# Patient Record
Sex: Female | Born: 1990 | Hispanic: Yes | Smoking: Never smoker
Health system: Southern US, Community
[De-identification: ages and names within clinical notes are randomized; demographics above are authoritative.]

## PROBLEM LIST (undated history)

## (undated) DIAGNOSIS — E669 Obesity, unspecified: Secondary | ICD-10-CM

---

## 2013-02-21 ENCOUNTER — Emergency Department: Payer: Self-pay | Admitting: Emergency Medicine

## 2013-10-15 ENCOUNTER — Emergency Department: Payer: Self-pay | Admitting: Emergency Medicine

## 2013-10-15 LAB — COMPREHENSIVE METABOLIC PANEL
ALK PHOS: 140 U/L — AB
Albumin: 3.2 g/dL — ABNORMAL LOW (ref 3.4–5.0)
Anion Gap: 7 (ref 7–16)
BUN: 10 mg/dL (ref 7–18)
Bilirubin,Total: 0.2 mg/dL (ref 0.2–1.0)
CO2: 29 mmol/L (ref 21–32)
Calcium, Total: 8.5 mg/dL (ref 8.5–10.1)
Chloride: 106 mmol/L (ref 98–107)
Creatinine: 1.02 mg/dL (ref 0.60–1.30)
EGFR (African American): >60
EGFR (Non-African Amer.): >60
Glucose: 100 mg/dL — ABNORMAL HIGH (ref 65–99)
Osmolality: 282 (ref 275–301)
Potassium: 3.6 mmol/L (ref 3.5–5.1)
SGOT(AST): 63 U/L — ABNORMAL HIGH (ref 15–37)
SGPT (ALT): 89 U/L — ABNORMAL HIGH
SODIUM: 142 mmol/L (ref 136–145)
Total Protein: 7.9 g/dL (ref 6.4–8.2)

## 2013-10-15 LAB — URINALYSIS, COMPLETE
Bilirubin,UR: NEGATIVE
Blood: NEGATIVE
GLUCOSE, UR: NEGATIVE mg/dL (ref 0–75)
KETONE: NEGATIVE
Leukocyte Esterase: NEGATIVE
NITRITE: NEGATIVE
Ph: 7 (ref 4.5–8.0)
Protein: NEGATIVE
RBC,UR: 1 /HPF (ref 0–5)
SPECIFIC GRAVITY: 1.014 (ref 1.003–1.030)
WBC UR: 1 /HPF (ref 0–5)

## 2013-10-15 LAB — CBC
HCT: 39.2 % (ref 35.0–47.0)
HGB: 12.8 g/dL (ref 12.0–16.0)
MCH: 27.9 pg (ref 26.0–34.0)
MCHC: 32.7 g/dL (ref 32.0–36.0)
MCV: 85 fL (ref 80–100)
PLATELETS: 323 10*3/uL (ref 150–440)
RBC: 4.59 10*6/uL (ref 3.80–5.20)
RDW: 13.9 % (ref 11.5–14.5)
WBC: 12.4 10*3/uL — AB (ref 3.6–11.0)

## 2013-10-15 LAB — LIPASE, BLOOD: LIPASE: 94 U/L (ref 73–393)

## 2013-10-19 ENCOUNTER — Emergency Department: Payer: Self-pay | Admitting: Internal Medicine

## 2013-10-19 LAB — CBC WITH DIFFERENTIAL/PLATELET
Basophil #: 0.1 10*3/uL (ref 0.0–0.1)
Basophil %: 0.6 %
Eosinophil #: 0.2 10*3/uL (ref 0.0–0.7)
Eosinophil %: 1.8 %
HCT: 38.8 % (ref 35.0–47.0)
HGB: 12.6 g/dL (ref 12.0–16.0)
LYMPHS PCT: 27.5 %
Lymphocyte #: 3.6 10*3/uL (ref 1.0–3.6)
MCH: 27.5 pg (ref 26.0–34.0)
MCHC: 32.4 g/dL (ref 32.0–36.0)
MCV: 85 fL (ref 80–100)
Monocyte #: 0.6 x10 3/mm (ref 0.2–0.9)
Monocyte %: 4.9 %
NEUTROS ABS: 8.4 10*3/uL — AB (ref 1.4–6.5)
NEUTROS PCT: 65.2 %
PLATELETS: 360 10*3/uL (ref 150–440)
RBC: 4.56 10*6/uL (ref 3.80–5.20)
RDW: 13.7 % (ref 11.5–14.5)
WBC: 13 10*3/uL — ABNORMAL HIGH (ref 3.6–11.0)

## 2013-10-19 LAB — COMPREHENSIVE METABOLIC PANEL
ANION GAP: 7 (ref 7–16)
Albumin: 3.3 g/dL — ABNORMAL LOW (ref 3.4–5.0)
Alkaline Phosphatase: 137 U/L — ABNORMAL HIGH
BUN: 12 mg/dL (ref 7–18)
Bilirubin,Total: 0.2 mg/dL (ref 0.2–1.0)
CREATININE: 0.76 mg/dL (ref 0.60–1.30)
Calcium, Total: 8.6 mg/dL (ref 8.5–10.1)
Chloride: 105 mmol/L (ref 98–107)
Co2: 28 mmol/L (ref 21–32)
EGFR (African American): >60
EGFR (Non-African Amer.): >60
GLUCOSE: 111 mg/dL — AB (ref 65–99)
Osmolality: 280 (ref 275–301)
Potassium: 4.1 mmol/L (ref 3.5–5.1)
SGOT(AST): 54 U/L — ABNORMAL HIGH (ref 15–37)
SGPT (ALT): 69 U/L — ABNORMAL HIGH
Sodium: 140 mmol/L (ref 136–145)
Total Protein: 7.7 g/dL (ref 6.4–8.2)

## 2013-10-19 LAB — LIPASE, BLOOD: LIPASE: 135 U/L (ref 73–393)

## 2013-10-19 LAB — URINALYSIS, COMPLETE
Bacteria: NONE SEEN
Bilirubin,UR: NEGATIVE
Glucose,UR: NEGATIVE mg/dL (ref 0–75)
Ketone: NEGATIVE
Leukocyte Esterase: NEGATIVE
Nitrite: NEGATIVE
PROTEIN: NEGATIVE
Ph: 6 (ref 4.5–8.0)
Specific Gravity: 1.008 (ref 1.003–1.030)
Squamous Epithelial: 1

## 2013-10-30 DIAGNOSIS — E559 Vitamin D deficiency, unspecified: Secondary | ICD-10-CM | POA: Insufficient documentation

## 2013-10-30 DIAGNOSIS — Z6841 Body Mass Index (BMI) 40.0 and over, adult: Secondary | ICD-10-CM

## 2014-06-25 ENCOUNTER — Emergency Department
Admit: 2014-06-25 | Disposition: A | Discharge status: Home / Self Care | Payer: Self-pay | Admitting: Emergency Medicine

## 2014-06-25 LAB — CBC WITH DIFFERENTIAL/PLATELET
BASOS ABS: 0.1 10*3/uL (ref 0.0–0.1)
Basophil %: 0.5 %
EOS ABS: 0.4 10*3/uL (ref 0.0–0.7)
Eosinophil %: 2.5 %
HCT: 38.2 % (ref 35.0–47.0)
HGB: 12.9 g/dL (ref 12.0–16.0)
Lymphocyte #: 3.7 10*3/uL — ABNORMAL HIGH (ref 1.0–3.6)
Lymphocyte %: 25.7 %
MCH: 28 pg (ref 26.0–34.0)
MCHC: 33.7 g/dL (ref 32.0–36.0)
MCV: 83 fL (ref 80–100)
Monocyte #: 0.9 x10 3/mm (ref 0.2–0.9)
Monocyte %: 5.9 %
NEUTROS ABS: 9.5 10*3/uL — AB (ref 1.4–6.5)
Neutrophil %: 65.4 %
PLATELETS: 300 10*3/uL (ref 150–440)
RBC: 4.6 10*6/uL (ref 3.80–5.20)
RDW: 13.3 % (ref 11.5–14.5)
WBC: 14.4 10*3/uL — AB (ref 3.6–11.0)

## 2014-06-25 LAB — COMPREHENSIVE METABOLIC PANEL
ALT: 41 U/L
Albumin: 3.5 g/dL
Alkaline Phosphatase: 113 U/L
Anion Gap: 8 (ref 7–16)
BUN: 13 mg/dL
Bilirubin,Total: 0.2 mg/dL — ABNORMAL LOW
CREATININE: 0.42 mg/dL — AB
Calcium, Total: 8.7 mg/dL — ABNORMAL LOW
Chloride: 105 mmol/L
Co2: 23 mmol/L
Glucose: 108 mg/dL — ABNORMAL HIGH
Potassium: 3.6 mmol/L
SGOT(AST): 40 U/L
Sodium: 136 mmol/L
TOTAL PROTEIN: 7.6 g/dL

## 2014-06-25 LAB — URINALYSIS, COMPLETE
Bacteria: NONE SEEN
Bilirubin,UR: NEGATIVE
Glucose,UR: NEGATIVE mg/dL (ref 0–75)
Ketone: NEGATIVE
LEUKOCYTE ESTERASE: NEGATIVE
Nitrite: NEGATIVE
PH: 7 (ref 4.5–8.0)
Protein: NEGATIVE
Specific Gravity: 1.015 (ref 1.003–1.030)
WBC UR: NONE SEEN /HPF (ref 0–5)

## 2014-06-25 LAB — WET PREP, GENITAL

## 2014-06-26 LAB — GC/CHLAMYDIA PROBE AMP

## 2015-05-29 ENCOUNTER — Encounter: Payer: Self-pay | Admitting: Emergency Medicine

## 2015-05-29 ENCOUNTER — Emergency Department: Payer: Self-pay

## 2015-05-29 ENCOUNTER — Emergency Department
Admission: EM | Admit: 2015-05-29 | Discharge: 2015-05-29 | Disposition: A | Discharge status: Home / Self Care | Payer: Self-pay | Attending: Emergency Medicine | Admitting: Emergency Medicine

## 2015-05-29 DIAGNOSIS — R202 Paresthesia of skin: Secondary | ICD-10-CM | POA: Insufficient documentation

## 2015-05-29 DIAGNOSIS — H9311 Tinnitus, right ear: Secondary | ICD-10-CM | POA: Insufficient documentation

## 2015-05-29 DIAGNOSIS — Z3202 Encounter for pregnancy test, result negative: Secondary | ICD-10-CM | POA: Insufficient documentation

## 2015-05-29 LAB — POCT PREGNANCY, URINE: Preg Test, Ur: NEGATIVE

## 2015-05-29 NOTE — ED Notes (Signed)
Patient reports that she is having right ear pain and a "beeping" in her ear all day yesterday.

## 2015-05-29 NOTE — Discharge Instructions (Signed)
Follow-up with otolaryngology (Ear Nose and Throat) doctor. Return to the emergency room if you develop severe headache, droopy face, weakness in arm or leg, trouble speaking or walking, fever, severe headache, neck pain, facial swelling or other new concerns arise  Tinnitus (Tinnitus) El trmino tinnitus hace referencia a la percepcin de un sonido que no se corresponde con ninguna fuente real para ese sonido. A menudo se lo describe como zumbido de odos. Sin embargo, las personas que sufren esta afeccin pueden percibir diferentes ruidos. Una persona puede percibir el sonido en uno o en ambos odos.  Los sonidos del tinnitus pueden ser [place], fuertes o de intensidad intermedia. El tinnitus puede prolongarse pocos segundos o ser constante durante [address]. Puede desaparecer sin tratamiento y regresar en distintos momentos. Cuando el tinnitus es permanente u ocurre con frecuencia, puede causar otros problemas, por ejemplo, dificultad para dormir y para concentrarse. Casi todas las Environmental health practitioner tinnitus en algn momento. El tinnitus a Air cabin crew (crnico) o que regresa con frecuencia es un problema que puede requerir Psychologist, prison and probation services.  CAUSAS  A menudo se desconoce la causa del tinnitus. En algunos casos, puede ser [employer] de otros problemas u otras afecciones, entre ellas:   Exposicin a ruidos fuertes de [place], [place] u otras fuentes.  Prdida auditiva.  Infecciones de los odos o de los senos paranasales.  Acumulacin de cerumen.  Un objeto extrao en el odo.  Uso de ciertos medicamentos.  Consumo de alcohol y cafena.  Hipertensin arterial.  Cardiopatas.  Anemia.  Alergias.  Enfermedad de [place].  Problemas de tiroides.  Tumores.  Dilatacin de una porcin de un vaso sanguneo debilitado (aneurisma). SNTOMAS El principal sntoma de tinnitus es la percepcin de un sonido que no se corresponde con ninguna fuente, no proviene de Dealer. El sonido  puede percibirse como lo siguiente:   Timbre.  Crepitacin.  Zumbido.  El soplido del aire, similar al sonido que se percibe en una caracola.  Sibilancia.  Silbido.  Chisporroteo.  Runrn.  Una corriente de agua.  Una nota musical sostenida. DIAGNSTICO  El diagnstico de tinnitus se basa en los sntomas. El [employer] har un examen fsico. Se realizar un examen auditivo exhaustivo (audiometra) si el tinnitus:   Afecta un solo odo (unilateral).  Causa dificultades [id number].  Dura ms de . Adems, tal vez deba consultar a un mdico especialista en trastornos auditivos (audilogo). Pueden pedirle que responda un cuestionario para determinar la gravedad del tinnitus que padece. Se pueden hacer estudios para ayudar a Production assistant, radio causa y Sales promotion account executive otras enfermedades. Estos pueden incluir los siguientes:  Estudios de diagnstico por imgenes de la cabeza y el cerebro, por ejemplo:  Tomografa computarizada.  Resonancia magntica.  Un estudio de diagnstico por imgenes de los vasos sanguneos (angiografa). TRATAMIENTO  A veces, el tratamiento de una enfermedad preexistente hace que el tinnitus desaparezca. Si el tinnitus contina, probablemente debe realizar [employer] tratamientos, [place] otros:  Medicamentos, como determinados antidepresivos o pastillas para dormir.  Generadores de sonido para enmascarar el tinnitus. Estos incluyen los siguientes:  Aparatos de sonido de mesa que reproducen sonidos relajantes para ayudarlo a dormir.  Dispositivos inteligentes que se adaptan al odo y reproducen sonidos o [place].  Un pequeo dispositivo que [place] auriculares para emitir una seal con msica (estimulacin Designer, industrial/product). Con el tiempo, esto puede modificar las redes del cerebro y reducir la sensibilidad al tinnitus. Este dispositivo se [place] en los casos muy graves cuando ningn otro tratamiento  resulta eficaz.  Terapia y orientacin para  ayudarlo a controlar el estrs que significa vivir con tinnitus.  El uso de audfonos o implantes cocleares, si el tinnitus guarda relacin con la prdida de la audicin. INSTRUCCIONES PARA EL CUIDADO EN EL HOGAR  Cuando sea posible, no permanezca en lugares ruidosos y no se exponga a sonidos fuertes.  Use dispositivos de proteccin de la audicin, por ejemplo, tapones, cuando est expuesto a ruidos fuertes.  No consuma sustancias estimulantes, como nicotina, alcohol o cafena.  Ponga en prctica tcnicas para reducir el estrs, como meditacin, yoga o respiracin profunda.  Use un aparato de sonido de fondo, un humidificador u otros dispositivos para enmascarar el sonido del tinnitus.  Duerma con la cabeza levemente elevada. Esto puede reducir el impacto del tinnitus.  Intente descansar lo suficiente todas las noches. SOLICITE ATENCIN MDICA SI:  Tiene tinnitus en un solo odo.  El tinnitus se prolonga durante 3semanas o ms tiempo y no se detiene.  Las medidas de cuidados en el hogar no Comptrollerresultan eficaces.  Tiene tinnitus despus de sufrir una lesin en la cabeza.  Tiene tinnitus junto con alguno de estos sntomas:  Mareos.  Prdida del equilibrio.  Nuseas y vmitos.   Esta informacin no tiene Theme park managercomo fin reemplazar el consejo del mdico. Asegrese de hacerle al mdico cualquier pregunta que tenga.   Document Released: 02/13/2005 Document Revised: 03/06/2014 Elsevier Interactive Patient Education [employer]2016 Elsevier Inc.

## 2015-05-29 NOTE — ED Notes (Signed)
Pt reports right ear pain starting yesterday. Pt states " I hear a beep in it." Pt denies any drainage or fevers

## 2015-05-29 NOTE — ED Provider Notes (Signed)
[facility] Emergency Department Provider Note  ____________________________________________  Time seen: Approximately 5:41 AM  I have reviewed the triage vital signs and the nursing notes.   HISTORY  Chief Complaint Otalgia  Offered Spanish interpreter the patient, she states she does not need one. She seems to speak and very fluently.  HPI Kimberly Bentley is a 25 y.o. female presents for evaluation of a "beeping" feeling in her right ear.She reports about ongoing for about 2-3 days. She also had a recent "cold" that she just got over her she had a cough and runny nose. She also reports she feels a slight feeling of tingling in the area of the right ear.  Denies any use of any aspirin or medications.  Denies pregnancy. No headache, neck pain, neck stiffness. No fevers or chills.  No nausea or vomiting. Denies any trouble walking. No facial droop. No slurred speech. No dizziness.  She reports that beeping or buzzing is a light constant sounds. It is not a pulsating sound. He is able to hear with both ears though it seems just slightly less than the right.   History reviewed. No pertinent past medical history.  There are no active problems to display for this patient.   History reviewed. No pertinent past surgical history.  No current outpatient prescriptions on file.  Allergies Review of patient's allergies indicates no known allergies.  No family history on file. She denies any allergies Denies any medical problems to Denies taking any medicine.  Social History Social History  Substance Use Topics  . Smoking status: Never Smoker   . Smokeless tobacco: None  . Alcohol Use: No    Review of Systems Constitutional: No fever/chills Eyes: No visual changes. ENT: No sore throat. Cardiovascular: Denies chest pain. Respiratory: Denies shortness of breath. Gastrointestinal: No abdominal pain.  No nausea, no vomiting.  No diarrhea.  No  constipation. Genitourinary: Negative for dysuria. Musculoskeletal: Negative for back pain. Skin: Negative for rash. Neurological: Negative for headaches, focal weakness or numbnessSuffered a slight tingling feeling around the back of the right ear.  10-point ROS otherwise negative.  ____________________________________________   PHYSICAL EXAM:  VITAL SIGNS: ED Triage Vitals  Enc Vitals Group     BP 05/29/15 0343 139/74 mmHg     Pulse Rate 05/29/15 0341 81     Resp 05/29/15 0341 18     Temp 05/29/15 0341 98.4 F (36.9 C)     Temp Source 05/29/15 0341 Oral     SpO2 05/29/15 0341 97 %     Weight 05/29/15 0341 285 lb (129.275 kg)     Height 05/29/15 0341  (1.626 m)     Head Cir --      Peak Flow --      Pain Score 05/29/15 0343 10     Pain Loc --      Pain Edu? --      Excl. in GC? --    Constitutional: Alert and oriented. Well appearing and in no acute distress. Eyes: Conjunctivae are normal. PERRL. EOMI. Head: Atraumatic. Nose: No congestion/rhinnorhea.Normal tympanic membranes bilaterally. Normal canals bilaterally.Patient is able to distinguish finger rub with both ears. No obvious right to left deficit with hearing. Mouth/Throat: Mucous membranes are moist.  Oropharynx non-erythematous. Neck: No stridor.   Cardiovascular: Normal rate, regular rhythm. Grossly normal heart sounds.  Good peripheral circulation. Respiratory: Normal respiratory effort.  No retractions. Lungs CTAB. Gastrointestinal: Soft and nontender. No distention. No abdominal bruits. No CVA tenderness.  Musculoskeletal: No lower extremity tenderness nor edema.  No joint effusions. Neurologic:  Normal speech and language. No gross focal neurologic deficits are appreciated. No gait instability.  NIH score equals 0, performed by me at bedside. The patient has no pronator drift. The patient has normal cranial nerve exam though she does note a slight area of what she states is a tingling just posterior to  the right ear, the remainder of the face is reported as normal and sensation. Extraocular movements are normal. Visual fields are normal. Patient has 5 out of 5 strength in all extremities. There is no numbness or gross, acute sensory abnormality in the extremities bilaterally. No speech disturbance. No dysarthria. No aphasia. No ataxia. Normal finger nose finger bilat. Patient speaking in full and clear sentences.   Skin:  Skin is warm, dry and intact. No rash noted. Psychiatric: Mood and affect are normal. Speech and behavior are normal.  ____________________________________________   LABS (all labs ordered are listed, but only abnormal results are displayed)  Labs Reviewed  POC URINE PREG, ED  POCT PREGNANCY, URINE   ____________________________________________  EKG   ____________________________________________  RADIOLOGY  CT Head Wo Contrast (Final result) Result time: 05/29/15 06:25:20   Final result by Rad Results In Interface (05/29/15 06:25:20)   Narrative:   CLINICAL DATA: Acute onset of right ear pain. Hearing beep in right ear. Initial encounter.  EXAM: CT HEAD WITHOUT CONTRAST  TECHNIQUE: Contiguous axial images were obtained from the base of the skull through the vertex without intravenous contrast.  COMPARISON: None.  FINDINGS: There is no evidence of acute infarction, mass lesion, or intra- or extra-axial hemorrhage on CT.  The posterior fossa, including the cerebellum, brainstem and fourth ventricle, is within normal limits. The third and lateral ventricles, and basal ganglia are unremarkable in appearance. The cerebral hemispheres are symmetric in appearance, with normal gray-white differentiation. No mass effect or midline shift is seen.  There is no evidence of fracture; visualized osseous structures are unremarkable in appearance.  The right external auditory canal is grossly unremarkable. The ossicles are not well characterized  on this study. The orbits are within normal limits. The paranasal sinuses and mastoid air cells are well-aerated. No significant soft tissue abnormalities are seen.  IMPRESSION: Unremarkable noncontrast CT of the head.   Electronically Signed By: Roanna RaiderJeffery Chang M.D. On: 05/29/2015 06:25    ____________________________________________   PROCEDURES  Procedure(s) performed: None  Critical Care performed: No  ____________________________________________   INITIAL IMPRESSION / ASSESSMENT AND PLAN / ED COURSE  Pertinent labs & imaging results that were available during my care of the patient were reviewed by me and considered in my medical decision making (see chart for details).  Patient transfer ringing sensation in the right ear. Clinical exam seems very normal, afebrile, no evidence of acute abnormality other than slight tingling and some slight dulling sensation behind the right ear. She denies any dizziness. No strokelike symptoms. Normal neurologic exam.  Discussed with the patient, risks and benefits of CT to evaluate acute causes such as possible tumor or far less likely a small stroke. He noted her eye feels is likely somehow related to her recent upper respiratory infection that is improving, however think it is reasonable to obtain a CT.  ----------------------------------------- 6:31 AM on 05/29/2015 -----------------------------------------  Patient in no distress. Seems to have tinnitus, questionable etiology at this point. No evidence of acute intracranial abnormality, no evidence of ischemia on CT, and her neurologic exam very reassuring. I have recommended  the patient herefor follow-up and provided close return precautions. ____________________________________________   FINAL CLINICAL IMPRESSION(S) / ED DIAGNOSES  Final diagnoses:  Tinnitus, right      Sharyn Creamer, MD 05/29/15 [phone]

## 2016-03-19 ENCOUNTER — Encounter: Payer: Self-pay | Admitting: Emergency Medicine

## 2016-03-19 ENCOUNTER — Emergency Department
Admission: EM | Admit: 2016-03-19 | Discharge: 2016-03-19 | Disposition: A | Discharge status: Home / Self Care | Payer: BLUE CROSS/BLUE SHIELD | Attending: Emergency Medicine | Admitting: Emergency Medicine

## 2016-03-19 DIAGNOSIS — J09X2 Influenza due to identified novel influenza A virus with other respiratory manifestations: Secondary | ICD-10-CM | POA: Diagnosis not present

## 2016-03-19 DIAGNOSIS — J101 Influenza due to other identified influenza virus with other respiratory manifestations: Secondary | ICD-10-CM

## 2016-03-19 DIAGNOSIS — R05 Cough: Secondary | ICD-10-CM | POA: Diagnosis present

## 2016-03-19 LAB — INFLUENZA PANEL BY PCR (TYPE A & B)
INFLBPCR: NEGATIVE
Influenza A By PCR: POSITIVE — AB

## 2016-03-19 MED ORDER — ACETAMINOPHEN 325 MG PO TABS
ORAL_TABLET | ORAL | Status: AC
  Filled 2016-03-19: qty 2

## 2016-03-19 MED ORDER — GUAIFENESIN-CODEINE 100-10 MG/5ML PO SOLN: 5.0000 mL | ORAL | 0 refills | Status: DC | PRN

## 2016-03-19 MED ORDER — ACETAMINOPHEN 325 MG PO TABS
650.0000 mg | ORAL_TABLET | Freq: Once | ORAL | Status: AC | PRN
  Administered 2016-03-19: 650 mg via ORAL

## 2016-03-19 MED ORDER — OSELTAMIVIR PHOSPHATE 75 MG PO CAPS: 75.0000 mg | ORAL_CAPSULE | Freq: Two times a day (BID) | ORAL | 0 refills | Status: AC

## 2016-03-19 NOTE — ED Triage Notes (Signed)
Pt c/o cough, runny nose, congestion, and body aches since yesterday. No distress at this time. Ambulatory to triage.

## 2016-03-19 NOTE — ED Provider Notes (Signed)
[facility] Emergency Department Provider Note   ____________________________________________   First MD Initiated Contact with Patient 03/19/16 1403     (approximate)  I have reviewed the triage vital signs and the nursing notes.   HISTORY  Chief Complaint Generalized Body Aches   HPI Kimberly Bentley is a 26 y.o. female is here with complaint of sudden onset of symptoms yesterday. Patient states that she has hadcough, runny nose, congestion and body aches since yesterday. She denies any nausea,  vomiting or diarrhea. She has been taking over-the-counter medication without any relief. She states that there is no one in the home that is sick except for her at this time. Currently she rates her pain as a 10 over 10. She states her muscles feel weak when she is walking and the cough keeps her from being able to sleep.   History reviewed. No pertinent past medical history.  There are no active problems to display for this patient.   History reviewed. No pertinent surgical history.  Prior to Admission medications   Medication Sig Start Date End Date Taking? Authorizing Provider  guaiFENesin-codeine 100-10 MG/5ML syrup Take 5 mLs by mouth every 4 (four) hours as needed. 03/19/16   Tommi Rumpshonda L Sorren Vallier, PA-C  oseltamivir (TAMIFLU) 75 MG capsule Take 1 capsule (75 mg total) by mouth 2 (two) times daily. 03/19/16 03/24/16  Tommi Rumpshonda L Desmin Daleo, PA-C    Allergies Patient has no known allergies.  History reviewed. No pertinent family history.  Social History Social History  Substance Use Topics  . Smoking status: Never Smoker  . Smokeless tobacco: Not on file  . Alcohol use No    Review of Systems Constitutional: Positive fever/chills Eyes: No visual changes. ENT: Positive sore throat Cardiovascular: Denies chest pain. Respiratory: Denies shortness of breath. Gastrointestinal: No abdominal pain.  No nausea, no vomiting.  No diarrhea.    Musculoskeletal: Positive for generalized muscle aches. Skin: Negative for rash. Neurological: Negative for headaches, focal weakness or numbness.  10-point ROS otherwise negative.  ____________________________________________   PHYSICAL EXAM:  VITAL SIGNS: ED Triage Vitals  Enc Vitals Group     BP 03/19/16 1257 123/69     Pulse Rate 03/19/16 1257 (!) 120     Resp 03/19/16 1257 18     Temp 03/19/16 1257 (!) 102.9 F (39.4 C)     Temp Source 03/19/16 1257 Oral     SpO2 03/19/16 1257 98 %     Weight 03/19/16 1257 280 lb (127 kg)     Height 03/19/16 1257 5\' 4"  (1.626 m)     Head Circumference --      Peak Flow --      Pain Score 03/19/16 1304 10     Pain Loc --      Pain Edu? --      Excl. in GC? --     Constitutional: Alert and oriented. Well appearing and in no acute distress. Eyes: Conjunctivae are normal. PERRL. EOMI. Head: Atraumatic. Nose: Moderate congestion/rhinnorhea. Mouth/Throat: Mucous membranes are moist.  Oropharynx non-erythematous. Neck: No stridor.   Hematological/Lymphatic/Immunilogical: No cervical lymphadenopathy. Cardiovascular: Normal rate, regular rhythm. Grossly normal heart sounds.  Good peripheral circulation. Respiratory: Normal respiratory effort.  No retractions. Lungs CTAB. Gastrointestinal: Soft and nontender. No distention.  Musculoskeletal: No lower extremity tenderness nor edema.  No joint effusions. Neurologic:  Normal speech and language. No gross focal neurologic deficits are appreciated. No gait instability. Skin:  Skin is warm, dry and intact. No rash  noted. Psychiatric: Mood and affect are normal. Speech and behavior are normal.  ____________________________________________   LABS (all labs ordered are listed, but only abnormal results are displayed)  Labs Reviewed  INFLUENZA PANEL BY PCR (TYPE A & B) - Abnormal; Notable for the following:       Result Value   Influenza A By PCR POSITIVE (*)    All other components within  normal limits    PROCEDURES  Procedure(s) performed: None  Procedures  Critical Care performed: No  ____________________________________________   INITIAL IMPRESSION / ASSESSMENT AND PLAN / ED COURSE  Pertinent labs & imaging results that were available during my care of the patient were reviewed by me and considered in my medical decision making (see chart for details).  Symptoms are less than 24 hours having symptoms and we discussed these Tamiflu. Patient  was given Tylenol while in the department for a temperature of 102.9. Patient's temperature was reducing prior to discharge. Patient is continue taking Tylenol or ibuprofen as needed for fever. She was given a prescription for Tamiflu twice a day for 5 days and Robitussin AC for cough. When asked if there was any possibility that she could be pregnant she states that she is not been sexually active for an extremely long time and if there is no way she could be pregnant.      ____________________________________________   FINAL CLINICAL IMPRESSION(S) / ED DIAGNOSES  Final diagnoses:  Influenza A      NEW MEDICATIONS STARTED DURING THIS VISIT:  Discharge Medication List as of 03/19/2016  2:18 PM    START taking these medications   Details  guaiFENesin-codeine 100-10 MG/5ML syrup Take 5 mLs by mouth every 4 (four) hours as needed., Starting Sun 03/19/2016, Print    oseltamivir (TAMIFLU) 75 MG capsule Take 1 capsule (75 mg total) by mouth 2 (two) times daily., Starting Sun 03/19/2016, Until Fri 03/24/2016, Print         Note:  This document was prepared using Dragon voice recognition software and may include unintentional dictation errors.    Tommi Rumps, PA-C 03/19/16 1652    Nita Sickle, MD 03/21/16 [phone]

## 2016-03-19 NOTE — ED Notes (Signed)

## 2016-03-19 NOTE — Discharge Instructions (Signed)
Increase fluids. Begin taking Tylenol or ibuprofen as needed for fever. Tamiflu twice a day for the next 5 days. Robitussin-AC as needed for cough. This medication could cause drowsiness and therefore you should not drive while taking this medication. Follow-up with your primary care doctor if any continued problems or urgent concerns.

## 2017-11-23 ENCOUNTER — Other Ambulatory Visit: Payer: Self-pay | Admitting: Family Medicine

## 2017-11-23 ENCOUNTER — Ambulatory Visit
Admission: RE | Admit: 2017-11-23 | Discharge: 2017-11-23 | Disposition: A | Discharge status: Home / Self Care | Payer: Self-pay | Source: Ambulatory Visit | Attending: Family Medicine | Admitting: Family Medicine

## 2017-11-23 DIAGNOSIS — Z3401 Encounter for supervision of normal first pregnancy, first trimester: Secondary | ICD-10-CM | POA: Insufficient documentation

## 2017-11-29 ENCOUNTER — Other Ambulatory Visit: Payer: Self-pay | Admitting: Family Medicine

## 2017-11-29 DIAGNOSIS — Z3401 Encounter for supervision of normal first pregnancy, first trimester: Secondary | ICD-10-CM

## 2017-11-30 ENCOUNTER — Emergency Department: Payer: Self-pay

## 2017-11-30 ENCOUNTER — Other Ambulatory Visit: Payer: Self-pay

## 2017-11-30 ENCOUNTER — Emergency Department
Admission: EM | Admit: 2017-11-30 | Discharge: 2017-12-01 | Disposition: A | Discharge status: Home / Self Care | Payer: Self-pay | Attending: Emergency Medicine | Admitting: Emergency Medicine

## 2017-11-30 ENCOUNTER — Encounter: Payer: Self-pay | Admitting: Emergency Medicine

## 2017-11-30 DIAGNOSIS — R102 Pelvic and perineal pain unspecified side: Secondary | ICD-10-CM

## 2017-11-30 DIAGNOSIS — Y999 Unspecified external cause status: Secondary | ICD-10-CM | POA: Insufficient documentation

## 2017-11-30 DIAGNOSIS — Z3A00 Weeks of gestation of pregnancy not specified: Secondary | ICD-10-CM | POA: Insufficient documentation

## 2017-11-30 DIAGNOSIS — R1030 Lower abdominal pain, unspecified: Secondary | ICD-10-CM

## 2017-11-30 DIAGNOSIS — Y9389 Activity, other specified: Secondary | ICD-10-CM | POA: Insufficient documentation

## 2017-11-30 DIAGNOSIS — O26891 Other specified pregnancy related conditions, first trimester: Secondary | ICD-10-CM | POA: Insufficient documentation

## 2017-11-30 DIAGNOSIS — Z3490 Encounter for supervision of normal pregnancy, unspecified, unspecified trimester: Secondary | ICD-10-CM

## 2017-11-30 DIAGNOSIS — Y9289 Other specified places as the place of occurrence of the external cause: Secondary | ICD-10-CM | POA: Insufficient documentation

## 2017-11-30 DIAGNOSIS — O9989 Other specified diseases and conditions complicating pregnancy, childbirth and the puerperium: Secondary | ICD-10-CM | POA: Insufficient documentation

## 2017-11-30 LAB — URINALYSIS, COMPLETE (UACMP) WITH MICROSCOPIC
BACTERIA UA: NONE SEEN
Bilirubin Urine: NEGATIVE
Glucose, UA: NEGATIVE mg/dL
Hgb urine dipstick: NEGATIVE
Ketones, ur: NEGATIVE mg/dL
Leukocytes, UA: NEGATIVE
Nitrite: NEGATIVE
PH: 6 (ref 5.0–8.0)
Protein, ur: 30 mg/dL — AB
Specific Gravity, Urine: 1.008 (ref 1.005–1.030)

## 2017-11-30 LAB — CHLAMYDIA/NGC RT PCR (ARMC ONLY)
Chlamydia Tr: NOT DETECTED
N GONORRHOEAE: NOT DETECTED

## 2017-11-30 LAB — WET PREP, GENITAL
CLUE CELLS WET PREP: NONE SEEN
SPERM: NONE SEEN
Trich, Wet Prep: NONE SEEN
Yeast Wet Prep HPF POC: NONE SEEN

## 2017-11-30 LAB — COMPREHENSIVE METABOLIC PANEL
ALK PHOS: 89 U/L (ref 38–126)
ALT: 34 U/L (ref 0–44)
AST: 33 U/L (ref 15–41)
Albumin: 3.9 g/dL (ref 3.5–5.0)
Anion gap: 9 (ref 5–15)
BUN: 11 mg/dL (ref 6–20)
CO2: 23 mmol/L (ref 22–32)
Calcium: 9.2 mg/dL (ref 8.9–10.3)
Chloride: 104 mmol/L (ref 98–111)
Creatinine, Ser: 0.55 mg/dL (ref 0.44–1.00)
GFR calc non Af Amer: >60 mL/min (ref >60)
GLUCOSE: 141 mg/dL — AB (ref 70–99)
Potassium: 3.7 mmol/L (ref 3.5–5.1)
Sodium: 136 mmol/L (ref 135–145)
Total Bilirubin: 0.4 mg/dL (ref 0.3–1.2)
Total Protein: 7.8 g/dL (ref 6.5–8.1)

## 2017-11-30 LAB — CBC
HEMATOCRIT: 37.9 % (ref 35.0–47.0)
Hemoglobin: 12.5 g/dL (ref 12.0–16.0)
MCH: 26.4 pg (ref 26.0–34.0)
MCHC: 33 g/dL (ref 32.0–36.0)
MCV: 80 fL (ref 80.0–100.0)
Platelets: 343 10*3/uL (ref 150–440)
RBC: 4.74 MIL/uL (ref 3.80–5.20)
RDW: 15.7 % — AB (ref 11.5–14.5)
WBC: 12.2 10*3/uL — ABNORMAL HIGH (ref 3.6–11.0)

## 2017-11-30 LAB — LIPASE, BLOOD: Lipase: 24 U/L (ref 11–51)

## 2017-11-30 LAB — HCG, QUANTITATIVE, PREGNANCY: HCG, BETA CHAIN, QUANT, S: 598 m[IU]/mL — AB (ref <5)

## 2017-11-30 NOTE — ED Notes (Signed)
Officer barnes with [place] [employer] here to speak with pt.

## 2017-11-30 NOTE — ED Notes (Signed)
Patient transported to MRI 

## 2017-11-30 NOTE — ED Notes (Signed)
MRI tech here to screen pt

## 2017-11-30 NOTE — ED Triage Notes (Signed)
Pt arrived via POV with reports of abdominal pain, pt tearful in triage, states she recently found out she was pregnant, she estimated she was about [redacted] weeks pregnant, pt had an ultrasound, but did not show anything. Pt states her PCP told her she may only be about [redacted] weeks pregnant.  Pt states she just found out last Thursday.  Pt denies any vaginal bleeding reports lower abdominal pain on the right side that radiates to the middle.    This is the patients first pregnancy.

## 2017-11-30 NOTE — ED Notes (Signed)
Assessment: pt states she believes she is [redacted] weeks pregnant. Pt states she is having RL pelvic pain, cramping in nature. Pt denies vaginal discharge, vaginal bleeding. Pt denies other symptoms. Pt with clear yellow urine, denies dysuria.

## 2017-11-30 NOTE — ED Notes (Signed)
Pt updated on MRI and [place] process. Pt verbalizes understanding.

## 2017-11-30 NOTE — ED Notes (Signed)
MRI tech speaking with dr. Darnelle Catalan regarding MRI with contrast.
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
## 2017-11-30 NOTE — ED Notes (Signed)
First Nurse Note: Pt is 5-[redacted] week pregnant, c/o pain on right side. Pt is in NAD at this time.

## 2017-11-30 NOTE — ED Notes (Signed)
md in to see pt.  
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
## 2017-11-30 NOTE — ED Notes (Signed)
Pt returned from MRI. Additional warm blankets provided for comfort.
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
## 2017-12-01 NOTE — ED Notes (Signed)
Pt returned from ultrasound

## 2017-12-01 NOTE — Discharge Instructions (Addendum)
Fortunately no evidence of injury was identified on your workup.  Your pregnancy is very early and while a gestational sac is visualized, it is too early to identify fetus.  We recommend you follow-up with your primary care doctor and/or with an OB/GYN provider at the next available opportunity for further evaluation and work-up.  Return to the emergency department if you develop new or worsening symptoms that concern you.

## 2017-12-04 ENCOUNTER — Ambulatory Visit: Payer: Self-pay

## 2017-12-14 ENCOUNTER — Emergency Department: Payer: Self-pay

## 2017-12-14 ENCOUNTER — Emergency Department
Admission: EM | Admit: 2017-12-14 | Discharge: 2017-12-15 | Disposition: A | Discharge status: Home / Self Care | Payer: Self-pay | Attending: Emergency Medicine | Admitting: Emergency Medicine

## 2017-12-14 DIAGNOSIS — R519 Headache, unspecified: Secondary | ICD-10-CM

## 2017-12-14 DIAGNOSIS — R51 Headache: Secondary | ICD-10-CM

## 2017-12-14 DIAGNOSIS — G935 Compression of brain: Secondary | ICD-10-CM | POA: Insufficient documentation

## 2017-12-14 MED ORDER — DIPHENHYDRAMINE HCL 50 MG/ML IJ SOLN
50.0000 mg | Freq: Once | INTRAMUSCULAR | Status: AC
  Administered 2017-12-14: 50 mg via INTRAVENOUS
  Filled 2017-12-14: qty 1

## 2017-12-14 MED ORDER — KETOROLAC TROMETHAMINE 30 MG/ML IJ SOLN
30.0000 mg | Freq: Once | INTRAMUSCULAR | Status: AC
  Administered 2017-12-14: 30 mg via INTRAVENOUS
  Filled 2017-12-14: qty 1

## 2017-12-14 MED ORDER — METOCLOPRAMIDE HCL 5 MG/ML IJ SOLN
10.0000 mg | Freq: Once | INTRAMUSCULAR | Status: AC
  Administered 2017-12-14: 10 mg via INTRAVENOUS
  Filled 2017-12-14: qty 2

## 2017-12-14 MED ORDER — SODIUM CHLORIDE 0.9 % IV BOLUS
1000.0000 mL | Freq: Once | INTRAVENOUS | Status: AC
  Administered 2017-12-14: 1000 mL via INTRAVENOUS

## 2017-12-14 NOTE — ED Provider Notes (Signed)
[facility] Emergency Department Provider Note  Time seen: 9:37 PM  I have reviewed the triage vital signs and the nursing notes.   HISTORY  Chief Complaint Headache    HPI Kimberly Bentley is a 27 y.o. female with no past medical history besides a recent D&C for a 7-week pregnancy presents to the emergency department for a headache.  According to the patient she had a mild headache yesterday that has progressively worsened.  Boyfriend states today the patient was not acting like her normal self, he describes more of a flat affect.  Here the patient states 10/10 headache, denies any focal weakness numbness or confusion.  Denies slurred speech.  Denies any history of headaches previously.  States she has been very fatigued all day today.  No migraine history.  No fever.   No past medical history on file.  There are no active problems to display for this patient.   No past surgical history on file.  Prior to Admission medications   Medication Sig Start Date End Date Taking? Authorizing Provider  guaiFENesin-codeine 100-10 MG/5ML syrup Take 5 mLs by mouth every 4 (four) hours as needed. 03/19/16   Tommi Rumps, PA-C  Prenatal Vit-Fe Fumarate-FA (PRENATAL MULTIVITAMIN) TABS tablet Take 1 tablet by mouth daily.    [provider]    Allergies  Allergen Reactions  . Cheese Itching    No family history on file.  Social History Social History   Tobacco Use  . Smoking status: Never Smoker  . Smokeless tobacco: Never Used  Substance Use Topics  . Alcohol use: No  . Drug use: Not on file    Review of Systems Constitutional: Negative for fever. Eyes: Photophobia Cardiovascular: Negative for chest pain. Respiratory: Negative for shortness of breath. Gastrointestinal: Negative for abdominal pain, vomiting.  Some nausea. Musculoskeletal: Negative for musculoskeletal complaints Skin: Negative for skin complaints  Neurological:  Significant headache.  Denies focal weakness numbness confusion or slurred speech. All other ROS negative  ____________________________________________   PHYSICAL EXAM:  VITAL SIGNS: ED Triage Vitals [12/14/17 2054]  Enc Vitals Group     BP (!) 150/100     Pulse Rate 63     Resp 16     Temp 98.3 F (36.8 C)     Temp Source Oral     SpO2 99 %     Weight      Height      Head Circumference      Peak Flow      Pain Score 8     Pain Loc      Pain Edu?      Excl. in GC?    Constitutional: Alert and oriented. Well appearing and in no distress. Eyes: Normal exam ENT   Head: Normocephalic and atraumatic.   Mouth/Throat: Mucous membranes are moist. Cardiovascular: Normal rate, regular rhythm.  Respiratory: Normal respiratory effort without tachypnea nor retractions. Breath sounds are clear  Gastrointestinal: Soft and nontender. No distention.  Musculoskeletal: Nontender with normal range of motion in all extremities. Neurologic:  Normal speech and language.  Equal grip strength bilaterally.  No pronator drift.  5/5 motor in all extremities.  Patient does have a very slight right facial droop, family states this is new, possibly somewhat effort-induced. Skin:  Skin is warm, dry and intact.  Psychiatric: Mood and affect are normal.   ____________________________________________   RADIOLOGY  MRI pending  ____________________________________________   INITIAL IMPRESSION / ASSESSMENT AND PLAN / ED  COURSE  Pertinent labs & imaging results that were available during my care of the patient were reviewed by me and considered in my medical decision making (see chart for details).  Patient presents emergency department for a severe headache 10/10 which has progressively worsened throughout the day.  No history of significant headaches in the past.  Patient also has a slight right facial droop on examination possibly effort-induced.  Differential this time would include  complex/comp located migraine, migraine headache, CVA/TIA, given recent pregnancy would also raise suspicion for central venous thrombosis/dural venous thrombosis.  Given no history of significant headaches previously we will obtain MR imaging of the brain including an MRV.  We will dose IV Toradol, Reglan, Benadryl, IV fluids and continue to closely monitor.  Labs and MRI pending.  Patient care signed out to oncoming physician.  ____________________________________________   FINAL CLINICAL IMPRESSION(S) / ED DIAGNOSES  headache    Minna Antis, MD 12/14/17 2240
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
## 2017-12-14 NOTE — Discharge Instructions (Addendum)
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 Please call Dr. Lucienne Capers office (number listed below) for an appointment. Return to the ER for severe headache, changes in vision, dizziness, facial droop, slurred speech, or any new symptoms concerning to you.

## 2017-12-15 NOTE — ED Provider Notes (Signed)
----------------------------------------- 1:10 AM on 12/15/2017 -----------------------------------------   Blood pressure (!) 150/100, pulse 63, temperature 98.3 F (36.8 C), temperature source Oral, resp. rate 16, SpO2 99 %, unknown if currently breastfeeding.  Assuming care from Dr. Lenard Lance of [name] is a 27 y.o. female with a chief complaint of Headache .    Please refer to H&P by previous MD for further details.  The current plan of care is to f/u MRI/MRV.  MRI showing Chiari I malformation, no evidence of central venous thrombosis.  Patient's pain is fully resolved with a migraine cocktail.  She remains well-appearing and completely neurologically intact.  Discussed findings of MRI and recommended outpatient follow-up with neurosurgery.   I have personally reviewed the images performed during this visit and I agree with the Radiologist's read.   Interpretation by Radiologist:  Mr Brain Wo Contrast  Result Date: 12/14/2017 CLINICAL DATA:  27 y/o F; progressively worsening headache. Recent D and C. EXAM: MRI HEAD WITHOUT CONTRAST MRV HEAD WITHOUT CONTRAST TECHNIQUE: Multiplanar, multiecho pulse sequences of the brain and surrounding structures were obtained without intravenous contrast. Venogram of the head was obtained with venographic MRI technique without intravenous contrast multiplanar IVI reconstruction. COMPARISON:  05/29/2015 CT head FINDINGS: MRI head: Brain: Cerebellar tonsillar ectopia of 14 mm below the foramen magnum (series 6, image 12). Cerebellar tonsils have a pointed appearance and there is crowding of the cervicomedullary junction within the foramen magnum. Findings are compatible with Chiari 1 malformation. No reduced diffusion to suggest acute or early subacute infarction. No abnormal susceptibility hypointensity to indicate intracranial hemorrhage. No extra-axial collection, hydrocephalus, effacement of basilar cisterns, or focal mass effect of  the brain. No signal abnormality of the brain parenchyma. Vascular: Normal flow voids. Skull and upper cervical spine: Normal marrow signal. Sinuses/Orbits: Negative. Other: None. MRV head: Normal flow related signal is present within the superior sagittal sinus, the straight sinus, bilateral internal cerebral veins, bilateral basal veins of Rosenthal, bilateral transverse sinus, bilateral sigmoid sinus, and the upper internal jugular veins. Additionally, there is normal flow related signal within the large cortical veins. No evidence of dural venous sinus thrombosis. IMPRESSION: 1. 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 No hydrocephalus. Otherwise unremarkable MRI of the brain. 2. Normal MRV of the head. No evidence of dural venous sinus thrombosis. Electronically Signed   By: Mitzi Hansen M.D.   On: 12/14/2017 23:17   Mr Mrv Head Wo Cm  Result Date: 12/14/2017 CLINICAL DATA:  27 y/o F; progressively worsening headache. Recent D and C. EXAM: MRI HEAD WITHOUT CONTRAST MRV HEAD WITHOUT CONTRAST TECHNIQUE: Multiplanar, multiecho pulse sequences of the brain and surrounding structures were obtained without intravenous contrast. Venogram of the head was obtained with venographic MRI technique without intravenous contrast multiplanar IVI reconstruction. COMPARISON:  05/29/2015 CT head FINDINGS: MRI head: Brain: Cerebellar tonsillar ectopia of 14 mm below the foramen magnum (series 6, image 12). Cerebellar tonsils have a pointed appearance and there is crowding of the cervicomedullary junction within the foramen magnum. Findings are compatible with Chiari 1 malformation. No reduced diffusion to suggest acute or early subacute infarction. No abnormal susceptibility hypointensity to indicate intracranial hemorrhage. No extra-axial collection, hydrocephalus, effacement of basilar cisterns, or focal mass effect of the brain. No signal abnormality of the brain parenchyma.  Vascular: Normal flow voids. Skull and upper cervical spine: Normal marrow signal. Sinuses/Orbits: Negative. Other: None. MRV head: Normal flow related signal is present within the superior sagittal sinus, the straight sinus, bilateral  internal cerebral veins, bilateral basal veins of Rosenthal, bilateral transverse sinus, bilateral sigmoid sinus, and the upper internal jugular veins. Additionally, there is normal flow related signal within the large cortical veins. No evidence of dural venous sinus thrombosis. IMPRESSION: 1. 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 No hydrocephalus. Otherwise unremarkable MRI of the brain. 2. Normal MRV of the head. No evidence of dural venous sinus thrombosis. Electronically Signed   By: Mitzi Hansen M.D.   On: 12/14/2017 23:17           Kimberly Sickle, MD 12/15/17 [phone]

## 2017-12-20 ENCOUNTER — Ambulatory Visit
Admission: RE | Admit: 2017-12-20 | Discharge: 2017-12-20 | Disposition: A | Discharge status: Home / Self Care | Payer: Self-pay | Source: Ambulatory Visit | Attending: Family Medicine | Admitting: Family Medicine

## 2018-03-04 ENCOUNTER — Other Ambulatory Visit: Payer: Self-pay

## 2018-03-04 ENCOUNTER — Emergency Department
Admission: EM | Admit: 2018-03-04 | Discharge: 2018-03-04 | Disposition: A | Discharge status: Home / Self Care | Payer: Self-pay | Attending: Emergency Medicine | Admitting: Emergency Medicine

## 2018-03-04 ENCOUNTER — Encounter: Payer: Self-pay | Admitting: Emergency Medicine

## 2018-03-04 DIAGNOSIS — M791 Myalgia, unspecified site: Secondary | ICD-10-CM | POA: Insufficient documentation

## 2018-03-04 DIAGNOSIS — Z041 Encounter for examination and observation following transport accident: Secondary | ICD-10-CM | POA: Insufficient documentation

## 2018-03-04 DIAGNOSIS — M7918 Myalgia, other site: Secondary | ICD-10-CM

## 2018-03-04 DIAGNOSIS — Z79899 Other long term (current) drug therapy: Secondary | ICD-10-CM | POA: Insufficient documentation

## 2018-03-04 MED ORDER — CYCLOBENZAPRINE HCL 5 MG PO TABS: 5.0000 mg | ORAL_TABLET | Freq: Three times a day (TID) | ORAL | 0 refills | Status: DC | PRN

## 2018-03-04 MED ORDER — NAPROXEN 500 MG PO TABS: 500.0000 mg | ORAL_TABLET | Freq: Two times a day (BID) | ORAL | 0 refills | Status: AC
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
## 2018-03-06 ENCOUNTER — Emergency Department: Payer: No Typology Code available for payment source

## 2018-03-06 ENCOUNTER — Encounter: Payer: Self-pay | Admitting: Emergency Medicine

## 2018-03-06 ENCOUNTER — Emergency Department
Admission: EM | Admit: 2018-03-06 | Discharge: 2018-03-06 | Disposition: A | Discharge status: Home / Self Care | Payer: No Typology Code available for payment source | Attending: Emergency Medicine | Admitting: Emergency Medicine

## 2018-03-06 DIAGNOSIS — Y998 Other external cause status: Secondary | ICD-10-CM | POA: Diagnosis not present

## 2018-03-06 DIAGNOSIS — Y9389 Activity, other specified: Secondary | ICD-10-CM | POA: Insufficient documentation

## 2018-03-06 DIAGNOSIS — S199XXA Unspecified injury of neck, initial encounter: Secondary | ICD-10-CM | POA: Diagnosis present

## 2018-03-06 DIAGNOSIS — S161XXA Strain of muscle, fascia and tendon at neck level, initial encounter: Secondary | ICD-10-CM | POA: Insufficient documentation

## 2018-03-06 DIAGNOSIS — Y92410 Unspecified street and highway as the place of occurrence of the external cause: Secondary | ICD-10-CM | POA: Insufficient documentation

## 2018-03-06 DIAGNOSIS — S335XXA Sprain of ligaments of lumbar spine, initial encounter: Secondary | ICD-10-CM | POA: Insufficient documentation

## 2018-03-06 DIAGNOSIS — S161XXD Strain of muscle, fascia and tendon at neck level, subsequent encounter: Secondary | ICD-10-CM

## 2018-03-06 DIAGNOSIS — S339XXD Sprain of unspecified parts of lumbar spine and pelvis, subsequent encounter: Secondary | ICD-10-CM

## 2018-03-06 DIAGNOSIS — S335XXD Sprain of ligaments of lumbar spine, subsequent encounter: Secondary | ICD-10-CM

## 2018-03-06 MED ORDER — DIAZEPAM 5 MG PO TABS: 5.0000 mg | ORAL_TABLET | Freq: Three times a day (TID) | ORAL | 0 refills | Status: DC | PRN

## 2018-03-06 MED ORDER — TRAMADOL HCL 50 MG PO TABS: 50.0000 mg | ORAL_TABLET | Freq: Four times a day (QID) | ORAL | 0 refills | Status: DC | PRN

## 2018-03-06 NOTE — ED Notes (Signed)
Patient verbalized understanding of discharge instructions, no questions. Patient ambulated out of ED with steady gait in no distress.  
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
## 2018-03-06 NOTE — ED Notes (Signed)
Patient transported to X-ray 
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
## 2018-09-17 ENCOUNTER — Encounter: Payer: Self-pay | Admitting: Emergency Medicine

## 2018-09-17 ENCOUNTER — Other Ambulatory Visit: Payer: Self-pay | Admitting: Internal Medicine

## 2018-09-17 ENCOUNTER — Inpatient Hospital Stay
Admission: EM | Admit: 2018-09-17 | Discharge: 2018-09-17 | DRG: 177 | Disposition: A | Discharge status: Other Acute Inpatient Hospital | Payer: HRSA Program | Attending: Internal Medicine | Admitting: Internal Medicine

## 2018-09-17 ENCOUNTER — Inpatient Hospital Stay (HOSPITAL_COMMUNITY)
Admission: AD | Admit: 2018-09-17 | Discharge: 2018-09-21 | DRG: 177 | Disposition: A | Discharge status: Home / Self Care | Payer: HRSA Program | Source: Other Acute Inpatient Hospital | Attending: Internal Medicine | Admitting: Internal Medicine

## 2018-09-17 ENCOUNTER — Emergency Department: Payer: HRSA Program

## 2018-09-17 ENCOUNTER — Other Ambulatory Visit: Payer: Self-pay

## 2018-09-17 DIAGNOSIS — J069 Acute upper respiratory infection, unspecified: Secondary | ICD-10-CM

## 2018-09-17 DIAGNOSIS — J1289 Other viral pneumonia: Secondary | ICD-10-CM | POA: Diagnosis present

## 2018-09-17 DIAGNOSIS — A4189 Other specified sepsis: Secondary | ICD-10-CM | POA: Diagnosis present

## 2018-09-17 DIAGNOSIS — Z823 Family history of stroke: Secondary | ICD-10-CM

## 2018-09-17 DIAGNOSIS — J988 Other specified respiratory disorders: Secondary | ICD-10-CM | POA: Diagnosis not present

## 2018-09-17 DIAGNOSIS — A419 Sepsis, unspecified organism: Secondary | ICD-10-CM

## 2018-09-17 DIAGNOSIS — R0602 Shortness of breath: Secondary | ICD-10-CM | POA: Diagnosis not present

## 2018-09-17 DIAGNOSIS — Z79899 Other long term (current) drug therapy: Secondary | ICD-10-CM

## 2018-09-17 DIAGNOSIS — Z91018 Allergy to other foods: Secondary | ICD-10-CM

## 2018-09-17 DIAGNOSIS — U071 COVID-19: Secondary | ICD-10-CM | POA: Diagnosis present

## 2018-09-17 DIAGNOSIS — Z6841 Body Mass Index (BMI) 40.0 and over, adult: Secondary | ICD-10-CM

## 2018-09-17 DIAGNOSIS — J189 Pneumonia, unspecified organism: Secondary | ICD-10-CM

## 2018-09-17 DIAGNOSIS — R7989 Other specified abnormal findings of blood chemistry: Secondary | ICD-10-CM | POA: Diagnosis not present

## 2018-09-17 DIAGNOSIS — J9601 Acute respiratory failure with hypoxia: Secondary | ICD-10-CM

## 2018-09-17 DIAGNOSIS — Z833 Family history of diabetes mellitus: Secondary | ICD-10-CM | POA: Diagnosis not present

## 2018-09-17 HISTORY — DX: Obesity, unspecified: E66.9

## 2018-09-17 LAB — COMPREHENSIVE METABOLIC PANEL
ALT: 44 U/L (ref 0–44)
ALT: 44 U/L (ref 0–44)
AST: 48 U/L — ABNORMAL HIGH (ref 15–41)
AST: 51 U/L — ABNORMAL HIGH (ref 15–41)
Albumin: 3.3 g/dL — ABNORMAL LOW (ref 3.5–5.0)
Albumin: 3.1 g/dL — ABNORMAL LOW (ref 3.5–5.0)
Alkaline Phosphatase: 97 U/L (ref 38–126)
Alkaline Phosphatase: 95 U/L (ref 38–126)
Anion gap: 9 (ref 5–15)
Anion gap: 9 (ref 5–15)
BUN: 7 mg/dL (ref 6–20)
BUN: 6 mg/dL (ref 6–20)
CO2: 22 mmol/L (ref 22–32)
CO2: 22 mmol/L (ref 22–32)
Calcium: 8.5 mg/dL — ABNORMAL LOW (ref 8.9–10.3)
Calcium: 8.1 mg/dL — ABNORMAL LOW (ref 8.9–10.3)
Chloride: 106 mmol/L (ref 98–111)
Chloride: 109 mmol/L (ref 98–111)
Creatinine, Ser: 0.43 mg/dL — ABNORMAL LOW (ref 0.44–1.00)
Creatinine, Ser: 0.42 mg/dL — ABNORMAL LOW (ref 0.44–1.00)
GFR calc Af Amer: >60 mL/min (ref >60)
GFR calc Af Amer: >60 mL/min (ref >60)
GFR calc non Af Amer: >60 mL/min (ref >60)
GFR calc non Af Amer: >60 mL/min (ref >60)
Glucose, Bld: 118 mg/dL — ABNORMAL HIGH (ref 70–99)
Glucose, Bld: 125 mg/dL — ABNORMAL HIGH (ref 70–99)
Potassium: 3.6 mmol/L (ref 3.5–5.1)
Potassium: 3.7 mmol/L (ref 3.5–5.1)
Sodium: 137 mmol/L (ref 135–145)
Sodium: 140 mmol/L (ref 135–145)
Total Bilirubin: 0.4 mg/dL (ref 0.3–1.2)
Total Bilirubin: 0.5 mg/dL (ref 0.3–1.2)
Total Protein: 7.5 g/dL (ref 6.5–8.1)
Total Protein: 7.5 g/dL (ref 6.5–8.1)

## 2018-09-17 LAB — CBC WITH DIFFERENTIAL/PLATELET
Abs Immature Granulocytes: 0.02 10*3/uL (ref 0.00–0.07)
Abs Immature Granulocytes: 0.03 10*3/uL (ref 0.00–0.07)
Basophils Absolute: 0 10*3/uL (ref 0.0–0.1)
Basophils Absolute: 0 10*3/uL (ref 0.0–0.1)
Basophils Relative: 0 %
Basophils Relative: 0 %
Eosinophils Absolute: 0 10*3/uL (ref 0.0–0.5)
Eosinophils Absolute: 0 10*3/uL (ref 0.0–0.5)
Eosinophils Relative: 0 %
Eosinophils Relative: 0 %
HCT: 35.3 % — ABNORMAL LOW (ref 36.0–46.0)
HCT: 35.4 % — ABNORMAL LOW (ref 36.0–46.0)
Hemoglobin: 11.4 g/dL — ABNORMAL LOW (ref 12.0–15.0)
Hemoglobin: 11.5 g/dL — ABNORMAL LOW (ref 12.0–15.0)
Immature Granulocytes: 0 %
Immature Granulocytes: 0 %
Lymphocytes Relative: 10 %
Lymphocytes Relative: 16 %
Lymphs Abs: 0.7 10*3/uL (ref 0.7–4.0)
Lymphs Abs: 1 10*3/uL (ref 0.7–4.0)
MCH: 25.4 pg — ABNORMAL LOW (ref 26.0–34.0)
MCH: 25.9 pg — ABNORMAL LOW (ref 26.0–34.0)
MCHC: 32.3 g/dL (ref 30.0–36.0)
MCHC: 32.5 g/dL (ref 30.0–36.0)
MCV: 78.3 fL — ABNORMAL LOW (ref 80.0–100.0)
MCV: 80 fL (ref 80.0–100.0)
Monocytes Absolute: 0.2 10*3/uL (ref 0.1–1.0)
Monocytes Absolute: 0.3 10*3/uL (ref 0.1–1.0)
Monocytes Relative: 2 %
Monocytes Relative: 5 %
Neutro Abs: 4.9 10*3/uL (ref 1.7–7.7)
Neutro Abs: 6.2 10*3/uL (ref 1.7–7.7)
Neutrophils Relative %: 79 %
Neutrophils Relative %: 88 %
Platelets: 264 10*3/uL (ref 150–400)
Platelets: 266 10*3/uL (ref 150–400)
RBC: 4.41 MIL/uL (ref 3.87–5.11)
RBC: 4.52 MIL/uL (ref 3.87–5.11)
RDW: 14.3 % (ref 11.5–15.5)
RDW: 14.4 % (ref 11.5–15.5)
WBC: 6.3 10*3/uL (ref 4.0–10.5)
WBC: 7.1 10*3/uL (ref 4.0–10.5)
nRBC: 0 % (ref 0.0–0.2)
nRBC: 0 % (ref 0.0–0.2)

## 2018-09-17 LAB — C-REACTIVE PROTEIN: CRP: 13.4 mg/dL — ABNORMAL HIGH (ref <1.0)

## 2018-09-17 LAB — PREGNANCY, URINE: Preg Test, Ur: NEGATIVE

## 2018-09-17 LAB — PROCALCITONIN: Procalcitonin: <0.1 ng/mL

## 2018-09-17 LAB — LACTIC ACID, PLASMA: Lactic Acid, Venous: 1.1 mmol/L (ref 0.5–1.9)

## 2018-09-17 LAB — STREP PNEUMONIAE URINARY ANTIGEN: Strep Pneumo Urinary Antigen: NEGATIVE

## 2018-09-17 LAB — FIBRINOGEN: Fibrinogen: 695 mg/dL — ABNORMAL HIGH (ref 210–475)

## 2018-09-17 LAB — FIBRIN DERIVATIVES D-DIMER (ARMC ONLY): Fibrin derivatives D-dimer (ARMC): 539.7 ng/mL (FEU) — ABNORMAL HIGH (ref 0.00–499.00)

## 2018-09-17 LAB — TRIGLYCERIDES: Triglycerides: 137 mg/dL (ref <150)

## 2018-09-17 LAB — LACTATE DEHYDROGENASE: LDH: 273 U/L — ABNORMAL HIGH (ref 98–192)

## 2018-09-17 LAB — FERRITIN: Ferritin: 72 ng/mL (ref 11–307)

## 2018-09-17 LAB — TROPONIN I (HIGH SENSITIVITY)
Troponin I (High Sensitivity): 3 ng/L (ref <18)
Troponin I (High Sensitivity): 3 ng/L (ref <18)

## 2018-09-17 LAB — BRAIN NATRIURETIC PEPTIDE: B Natriuretic Peptide: 9 pg/mL (ref 0.0–100.0)

## 2018-09-17 LAB — PROTIME-INR
INR: 1 (ref 0.8–1.2)
Prothrombin Time: 13 seconds (ref 11.4–15.2)

## 2018-09-17 LAB — APTT: aPTT: 38 seconds — ABNORMAL HIGH (ref 24–36)

## 2018-09-17 LAB — SARS CORONAVIRUS 2 BY RT PCR (HOSPITAL ORDER, PERFORMED IN ~~LOC~~ HOSPITAL LAB): SARS Coronavirus 2: POSITIVE — AB

## 2018-09-17 MED ORDER — HEPARIN (PORCINE) 25000 UT/250ML-% IV SOLN: 10.0000 [IU]/kg/h | INTRAVENOUS | Status: DC

## 2018-09-17 MED ORDER — SODIUM CHLORIDE 0.9 % IV SOLN
100.0000 mg | INTRAVENOUS | Status: AC
  Administered 2018-09-18 – 2018-09-21 (×4): 100 mg via INTRAVENOUS
  Filled 2018-09-17 (×4): qty 20

## 2018-09-17 MED ORDER — SODIUM CHLORIDE 0.9% FLUSH: 3.0000 mL | INTRAVENOUS | Status: DC | PRN

## 2018-09-17 MED ORDER — TRAMADOL HCL 50 MG PO TABS: 50.0000 mg | ORAL_TABLET | Freq: Four times a day (QID) | ORAL | Status: DC | PRN

## 2018-09-17 MED ORDER — PRENATAL PLUS 27-1 MG PO TABS
1.0000 | ORAL_TABLET | Freq: Every day | ORAL | Status: DC
  Administered 2018-09-17: 1 via ORAL
  Filled 2018-09-17: qty 1

## 2018-09-17 MED ORDER — DEXAMETHASONE SODIUM PHOSPHATE 10 MG/ML IJ SOLN: 6.0000 mg | INTRAMUSCULAR | 0 refills | Status: DC

## 2018-09-17 MED ORDER — FAMOTIDINE 20 MG PO TABS: 20.0000 mg | ORAL_TABLET | Freq: Two times a day (BID) | ORAL | Status: DC

## 2018-09-17 MED ORDER — HYDROCOD POLST-CPM POLST ER 10-8 MG/5ML PO SUER
5.0000 mL | Freq: Two times a day (BID) | ORAL | Status: DC | PRN
  Administered 2018-09-17: 5 mL via ORAL
  Filled 2018-09-17: qty 5

## 2018-09-17 MED ORDER — SODIUM CHLORIDE 0.9 % IV SOLN
2.0000 g | INTRAVENOUS | Status: DC
  Administered 2018-09-17: 03:00:00 2 g via INTRAVENOUS
  Filled 2018-09-17: qty 20

## 2018-09-17 MED ORDER — ENOXAPARIN SODIUM 40 MG/0.4ML ~~LOC~~ SOLN
40.0000 mg | Freq: Two times a day (BID) | SUBCUTANEOUS | Status: DC
  Administered 2018-09-17 – 2018-09-21 (×8): 40 mg via SUBCUTANEOUS
  Filled 2018-09-17 (×8): qty 0.4

## 2018-09-17 MED ORDER — SODIUM CHLORIDE 0.9 % IV SOLN
500.0000 mg | INTRAVENOUS | Status: DC
  Administered 2018-09-17: 500 mg via INTRAVENOUS
  Filled 2018-09-17: qty 500

## 2018-09-17 MED ORDER — ONDANSETRON HCL 4 MG/2ML IJ SOLN: 4.0000 mg | Freq: Four times a day (QID) | INTRAMUSCULAR | Status: DC | PRN

## 2018-09-17 MED ORDER — VITAMIN C 500 MG PO TABS
1000.0000 mg | ORAL_TABLET | Freq: Every day | ORAL | Status: DC
  Administered 2018-09-17: 1000 mg via ORAL
  Filled 2018-09-17: qty 2

## 2018-09-17 MED ORDER — ACETAMINOPHEN 325 MG PO TABS: 650.0000 mg | ORAL_TABLET | Freq: Four times a day (QID) | ORAL | Status: DC | PRN

## 2018-09-17 MED ORDER — ORAL CARE MOUTH RINSE: 15.0000 mL | Freq: Two times a day (BID) | OROMUCOSAL | Status: DC

## 2018-09-17 MED ORDER — SODIUM CHLORIDE 0.9 % IV SOLN
Freq: Once | INTRAVENOUS | Status: AC
  Administered 2018-09-17: 05:00:00 via INTRAVENOUS

## 2018-09-17 MED ORDER — ONDANSETRON HCL 4 MG PO TABS: 4.0000 mg | ORAL_TABLET | Freq: Four times a day (QID) | ORAL | Status: DC | PRN

## 2018-09-17 MED ORDER — HEPARIN BOLUS VIA INFUSION: 4000.0000 [IU] | Freq: Once | INTRAVENOUS | Status: DC

## 2018-09-17 MED ORDER — MAGNESIUM HYDROXIDE 400 MG/5ML PO SUSP: 30.0000 mL | Freq: Every day | ORAL | Status: DC | PRN

## 2018-09-17 MED ORDER — AZITHROMYCIN 500 MG PO TABS: 500.0000 mg | ORAL_TABLET | Freq: Every day | ORAL | Status: DC

## 2018-09-17 MED ORDER — ACETAMINOPHEN 650 MG RE SUPP: 650.0000 mg | Freq: Four times a day (QID) | RECTAL | Status: DC | PRN

## 2018-09-17 MED ORDER — SODIUM CHLORIDE 0.9% FLUSH
3.0000 mL | Freq: Two times a day (BID) | INTRAVENOUS | Status: DC
  Administered 2018-09-17 – 2018-09-21 (×8): 3 mL via INTRAVENOUS

## 2018-09-17 MED ORDER — DEXAMETHASONE SODIUM PHOSPHATE 10 MG/ML IJ SOLN
6.0000 mg | INTRAMUSCULAR | Status: DC
  Administered 2018-09-18 – 2018-09-21 (×4): 6 mg via INTRAVENOUS
  Filled 2018-09-17 (×4): qty 1

## 2018-09-17 MED ORDER — ACETAMINOPHEN 325 MG PO TABS
650.0000 mg | ORAL_TABLET | Freq: Once | ORAL | Status: AC
  Administered 2018-09-17: 02:00:00 650 mg via ORAL
  Filled 2018-09-17: qty 2

## 2018-09-17 MED ORDER — SODIUM CHLORIDE 0.9 % IV BOLUS
1000.0000 mL | Freq: Once | INTRAVENOUS | Status: AC
  Administered 2018-09-17: 1000 mL via INTRAVENOUS

## 2018-09-17 MED ORDER — ZINC SULFATE 220 (50 ZN) MG PO CAPS
220.0000 mg | ORAL_CAPSULE | Freq: Every day | ORAL | Status: DC
  Administered 2018-09-17: 12:00:00 220 mg via ORAL
  Filled 2018-09-17 (×2): qty 1

## 2018-09-17 MED ORDER — VITAMIN D 25 MCG (1000 UNIT) PO TABS
1000.0000 [IU] | ORAL_TABLET | Freq: Every day | ORAL | Status: DC
  Administered 2018-09-17: 12:00:00 1000 [IU] via ORAL
  Filled 2018-09-17: qty 1

## 2018-09-17 MED ORDER — SODIUM CHLORIDE 0.9 % IV SOLN: INTRAVENOUS | Status: DC

## 2018-09-17 MED ORDER — SODIUM CHLORIDE 0.9 % IV SOLN
200.0000 mg | Freq: Once | INTRAVENOUS | Status: AC
  Administered 2018-09-17: 200 mg via INTRAVENOUS
  Filled 2018-09-17: qty 40

## 2018-09-17 MED ORDER — SODIUM CHLORIDE 0.9 % IV SOLN
100.0000 mg | INTRAVENOUS | Status: DC
  Filled 2018-09-17: qty 20

## 2018-09-17 MED ORDER — DEXAMETHASONE SODIUM PHOSPHATE 10 MG/ML IJ SOLN
10.0000 mg | Freq: Once | INTRAMUSCULAR | Status: AC
  Administered 2018-09-17: 03:00:00 10 mg via INTRAVENOUS
  Filled 2018-09-17: qty 1

## 2018-09-17 MED ORDER — GUAIFENESIN-DM 100-10 MG/5ML PO SYRP: 10.0000 mL | ORAL_SOLUTION | ORAL | Status: DC | PRN

## 2018-09-17 MED ORDER — DEXAMETHASONE SODIUM PHOSPHATE 10 MG/ML IJ SOLN: 6.0000 mg | INTRAMUSCULAR | Status: DC

## 2018-09-17 MED ORDER — ENOXAPARIN SODIUM 80 MG/0.8ML ~~LOC~~ SOLN
65.0000 mg | SUBCUTANEOUS | Status: DC
  Administered 2018-09-17: 65 mg via SUBCUTANEOUS
  Filled 2018-09-17: qty 0.8

## 2018-09-17 MED ORDER — HEPARIN (PORCINE) 25000 UT/250ML-% IV SOLN
1200.0000 [IU]/h | INTRAVENOUS | Status: DC
  Administered 2018-09-17: 1200 [IU]/h via INTRAVENOUS
  Filled 2018-09-17: qty 250

## 2018-09-17 MED ORDER — GUAIFENESIN ER 600 MG PO TB12
600.0000 mg | ORAL_TABLET | Freq: Two times a day (BID) | ORAL | Status: DC
  Administered 2018-09-17: 05:00:00 600 mg via ORAL
  Filled 2018-09-17: qty 1

## 2018-09-17 MED ORDER — CHLORHEXIDINE GLUCONATE CLOTH 2 % EX PADS: 6.0000 | MEDICATED_PAD | Freq: Every day | CUTANEOUS | Status: DC

## 2018-09-17 MED ORDER — TRAZODONE HCL 50 MG PO TABS: 25.0000 mg | ORAL_TABLET | Freq: Every evening | ORAL | Status: DC | PRN

## 2018-09-17 MED ORDER — HEPARIN BOLUS VIA INFUSION
4000.0000 [IU] | Freq: Once | INTRAVENOUS | Status: AC
  Administered 2018-09-17: 4000 [IU] via INTRAVENOUS
  Filled 2018-09-17: qty 4000

## 2018-09-17 MED ORDER — GUAIFENESIN ER 600 MG PO TB12: 600.0000 mg | ORAL_TABLET | Freq: Two times a day (BID) | ORAL | Status: DC | PRN

## 2018-09-17 MED ORDER — IPRATROPIUM-ALBUTEROL 20-100 MCG/ACT IN AERS
1.0000 | INHALATION_SPRAY | Freq: Four times a day (QID) | RESPIRATORY_TRACT | Status: DC | PRN
  Filled 2018-09-17: qty 4

## 2018-09-17 MED ORDER — SODIUM CHLORIDE 0.9 % IV SOLN: 2.0000 g | INTRAVENOUS | Status: DC

## 2018-09-17 MED ORDER — SODIUM CHLORIDE 0.9 % IV SOLN: 250.0000 mL | INTRAVENOUS | Status: DC | PRN

## 2018-09-17 MED ORDER — ACETAMINOPHEN 325 MG PO TABS
650.0000 mg | ORAL_TABLET | Freq: Four times a day (QID) | ORAL | Status: DC | PRN
  Filled 2018-09-17: qty 2

## 2018-09-17 MED ORDER — ENOXAPARIN SODIUM 80 MG/0.8ML ~~LOC~~ SOLN: 65.0000 mg | SUBCUTANEOUS | Status: DC

## 2018-09-17 MED ORDER — CYCLOBENZAPRINE HCL 10 MG PO TABS
5.0000 mg | ORAL_TABLET | Freq: Three times a day (TID) | ORAL | Status: DC | PRN
  Filled 2018-09-17: qty 0.5

## 2018-09-17 NOTE — Consult Note (Signed)
ANTICOAGULATION CONSULT NOTE - Initial Consult  Pharmacy Consult for Heparin infusion  Indication: COVID-19 with high D-dimer, high risk for coagulopathy  Allergies  Allergen Reactions  . Cheese Itching    Patient Measurements: Height: 5\' 4"  (162.6 cm) Weight: 280 lb (127 kg) IBW/kg (Calculated) : 54.7 Heparin Dosing Weight: 86 kg   Vital Signs: Temp: 101 F (38.3 C) (07/21 0032) Temp Source: Oral (07/21 0032) BP: 110/60 (07/21 0345) Pulse Rate: 91 (07/21 0345)  Labs: Recent Labs    09/17/18 0036  HGB 11.5*  HCT 35.4*  PLT 266  APTT 38*  LABPROT 13.0  INR 1.0  CREATININE 0.43*  TROPONINIHS 3    Estimated Creatinine Clearance: 141.9 mL/min (A) (by C-G formula based on SCr of 0.43 mg/dL (L)).   Medical History: Past Medical History:  Diagnosis Date  . Obesity     Assessment: Pharmacy consulted for heparin infusion dosing and monitoring for 28 yo female who is COVID-19 positive with high D-dimer. Patient high risk for coagulopathy.   Goal of Therapy:  Heparin level 0.3-0.7 units/ml Monitor platelets by anticoagulation protocol: Yes   Plan:  Baseline labs ordered.  Dosing weight: 83kg  Give 4000 units bolus x 1 Start heparin infusion at 1200 units/hr Check anti-Xa level in 6 hours and daily while on heparin Continue to monitor H&H and platelets  Pernell Dupre, PharmD, BCPS Clinical Pharmacist 09/17/2018 4:04 AM

## 2018-09-17 NOTE — Progress Notes (Signed)
Patient transferred to [facility]. Report called to Counsellor at [facility]. Patient to go to room 115.

## 2018-09-17 NOTE — ED Notes (Signed)
ED TO INPATIENT HANDOFF REPORT  ED Nurse Name and Phone #: gracie  S Name/Age/Gender Kimberly Bentley 28 y.o. female Room/Bed: ED11A/ED11A  Code Status   Code Status: Full Code  Home/SNF/Other Home Patient oriented to: self, place, time and situation Is this baseline? Yes   Triage Complete: Triage complete  Chief Complaint SOB  Triage Note Pt c/o chest pain and shortness of breath since this past Sunday. Pt presents febrile and tachycardiac.   Allergies Allergies  Allergen Reactions  . Cheese Itching    Level of Care/Admitting Diagnosis ED Disposition    ED Disposition Condition [facility] Area: [place] [[id number]]  Level of Care: Med-Surg [16]  Covid Evaluation: Confirmed COVID Positive  Diagnosis: COVID-19 [[id number]]  Admitting Physician: Christel Mormon [[id number]]  Attending Physician: Christel Mormon [[id number]]  Estimated length of stay: 3 - 4 days  Certification:: I certify this patient will need inpatient services for at least 2 midnights  PT Class (Do Not Modify): Inpatient [101]  PT Acc Code (Do Not Modify): Private [1]       B Medical/Surgery History Past Medical History:  Diagnosis Date  . Obesity    History reviewed. No pertinent surgical history.   A IV Location/Drains/Wounds Patient Lines/Drains/Airways Status   Active Line/Drains/Airways    Name:   Placement date:   Placement time:   Site:   Days:   Peripheral IV 09/17/18 Right Antecubital   09/17/18    0046    Antecubital   less than 1   Peripheral IV 09/17/18 Right Hand   09/17/18    0504    Hand   less than 1          Intake/Output Last 24 hours  Intake/Output Summary (Last 24 hours) at 09/17/2018 3016 Last data filed at 09/17/2018 0448 Gross per 24 hour  Intake 1164.04 ml  Output -  Net 1164.04 ml    Labs/Imaging Results for orders placed or performed during the hospital encounter of 09/17/18 (from the past 48 hour(s))  SARS  Coronavirus 2 [facility] order, Performed in [place] hospital lab)     Status: Abnormal   Collection Time: 09/17/18 12:36 AM   Specimen: Nasopharyngeal Swab  Result Value Ref Range   SARS Coronavirus 2 POSITIVE (A) NEGATIVE    Comment: RESULT CALLED TO, READ BACK BY AND VERIFIED WITH: RACHEL HAYDEN RN 0215 09/17/2018 HNM (NOTE) If result is NEGATIVE SARS-CoV-2 target nucleic acids are NOT DETECTED. The SARS-CoV-2 RNA is generally detectable in upper and lower  respiratory specimens during the acute phase of infection. The lowest  concentration of SARS-CoV-2 viral copies this assay can detect is 250  copies / mL. A negative result does not preclude SARS-CoV-2 infection  and should not be used as the sole basis for treatment or other  patient management decisions.  A negative result may occur with  improper specimen collection / handling, submission of specimen other  than nasopharyngeal swab, presence of viral mutation(s) within the  areas targeted by this assay, and inadequate number of viral copies  (<250 copies / mL). A negative result must be combined with clinical  observations, patient history, and epidemiological information. If result is POSITIVE SARS-CoV-2 target nucleic acids are DETECTED. T he SARS-CoV-2 RNA is generally detectable in upper and lower  respiratory specimens during the acute phase of infection.  Positive  results are indicative of active infection with SARS-CoV-2.  Clinical  correlation with patient history and  other diagnostic information is  necessary to determine patient infection status.  Positive results do  not rule out bacterial infection or co-infection with other viruses. If result is PRESUMPTIVE POSTIVE SARS-CoV-2 nucleic acids MAY BE PRESENT.   A presumptive positive result was obtained on the submitted specimen  and confirmed on repeat testing.  While 2019 novel coronavirus  (SARS-CoV-2) nucleic acids may be present in the submitted sample   additional confirmatory testing may be necessary for epidemiological  and / or clinical management purposes  to differentiate between  SARS-CoV-2 and other Sarbecovirus currently known to infect humans.  If clinically indicated additional testing with an alternate test  methodology [phone]) is  advised. The SARS-CoV-2 RNA is generally  detectable in upper and lower respiratory specimens during the acute  phase of infection. The expected result is Negative. Fact Sheet for Patients:  BoilerBrush.com.cy Fact Sheet for Healthcare Providers: https://pope.com/ This test is not yet approved or cleared by the [place] FDA and has been authorized for detection and/or diagnosis of SARS-CoV-2 by FDA under an Emergency Use Authorization (EUA).  This EUA will remain in effect (meaning this test can be used) for the duration of the COVID-19 declaration under Section 564(b)(1) of the Act, 21 U.S.C. section 360bbb-3(b)(1), unless the authorization is terminated or revoked sooner. Performed at [facility], [address] [place], [place] [address]   Lactic acid, plasma     Status: None   Collection Time: 09/17/18 12:36 AM  Result Value Ref Range   Lactic Acid, Venous 1.1 0.5 - 1.9 mmol/L    Comment: Performed at [facility], [address] [place], [place] [address]  CBC WITH DIFFERENTIAL     Status: Abnormal   Collection Time: 09/17/18 12:36 AM  Result Value Ref Range   WBC 6.3 4.0 - 10.5 K/uL   RBC 4.52 3.87 - 5.11 MIL/uL   Hemoglobin 11.5 (L) 12.0 - 15.0 g/dL   HCT 47.8 (L) 29.5 - 62.1 %   MCV 78.3 (L) 80.0 - 100.0 fL   MCH 25.4 (L) 26.0 - 34.0 pg   MCHC 32.5 30.0 - 36.0 g/dL   RDW 30.8 65.7 - 84.6 %   Platelets 266 150 - 400 K/uL   nRBC 0.0 0.0 - 0.2 %   Neutrophils Relative % 79 %   Neutro Abs 4.9 1.7 - 7.7 K/uL   Lymphocytes Relative 16 %   Lymphs Abs 1.0 0.7 - 4.0 K/uL   Monocytes Relative 5 %    Monocytes Absolute 0.3 0.1 - 1.0 K/uL   Eosinophils Relative 0 %   Eosinophils Absolute 0.0 0.0 - 0.5 K/uL   Basophils Relative 0 %   Basophils Absolute 0.0 0.0 - 0.1 K/uL   Immature Granulocytes 0 %   Abs Immature Granulocytes 0.02 0.00 - 0.07 K/uL    Comment: Performed at [facility], [address] [place], [place] [address]  Comprehensive metabolic panel     Status: Abnormal   Collection Time: 09/17/18 12:36 AM  Result Value Ref Range   Sodium 137 135 - 145 mmol/L   Potassium 3.6 3.5 - 5.1 mmol/L   Chloride 106 98 - 111 mmol/L   CO2 22 22 - 32 mmol/L   Glucose, Bld 118 (H) 70 - 99 mg/dL   BUN 7 6 - 20 mg/dL   Creatinine, Ser 2.84 (L) 0.44 - 1.00 mg/dL   Calcium 8.5 (L) 8.9 - 10.3 mg/dL   Total Protein 7.5 6.5 - 8.1 g/dL  Albumin 3.3 (L) 3.5 - 5.0 g/dL   AST 48 (H) 15 - 41 U/L   ALT 44 0 - 44 U/L   Alkaline Phosphatase 97 38 - 126 U/L   Total Bilirubin 0.4 0.3 - 1.2 mg/dL   GFR calc non Af Amer >60 >60 mL/min   GFR calc Af Amer >60 >60 mL/min   Anion gap 9 5 - 15    Comment: Performed at [facility], [address] [place], [place] [address]  Fibrin derivatives D-Dimer     Status: Abnormal   Collection Time: 09/17/18 12:36 AM  Result Value Ref Range   Fibrin derivatives D-dimer ([facility]) 539.70 (H) 0.00 - 499.00 ng/mL (FEU)    Comment: (NOTE) <> Exclusion of Venous Thromboembolism (VTE) - OUTPATIENT ONLY   (Emergency Department or [place])   0-499 ng/ml (FEU): With a low to intermediate pretest probability                      for VTE this test result excludes the diagnosis                      of VTE.   >499 ng/ml (FEU) : VTE not excluded; additional work up for VTE is                      required. <> Testing on Inpatients and Evaluation of Disseminated Intravascular   Coagulation (DIC) Reference Range:   0-499 ng/ml (FEU) Performed at [facility], [address] [place], [place] [address]   Procalcitonin     Status: None    Collection Time: 09/17/18 12:36 AM  Result Value Ref Range   Procalcitonin <0.10 ng/mL    Comment:        Interpretation: PCT (Procalcitonin) <= 0.5 ng/mL: Systemic infection (sepsis) is not likely. Local bacterial infection is possible. (NOTE)       Sepsis PCT Algorithm           Lower Respiratory Tract                                      Infection PCT Algorithm    ----------------------------     ----------------------------         PCT < 0.25 ng/mL                PCT < 0.10 ng/mL         Strongly encourage             Strongly discourage   discontinuation of antibiotics    initiation of antibiotics    ----------------------------     -----------------------------       PCT 0.25 - 0.50 ng/mL            PCT 0.10 - 0.25 ng/mL               OR       >80% decrease in PCT            Discourage initiation of                                            antibiotics      Encourage discontinuation  of antibiotics    ----------------------------     -----------------------------         PCT >= 0.50 ng/mL              PCT 0.26 - 0.50 ng/mL               AND        <80% decrease in PCT             Encourage initiation of                                             antibiotics       Encourage continuation           of antibiotics    ----------------------------     -----------------------------        PCT >= 0.50 ng/mL                  PCT > 0.50 ng/mL               AND         increase in PCT                  Strongly encourage                                      initiation of antibiotics    Strongly encourage escalation           of antibiotics                                     -----------------------------                                           PCT <= 0.25 ng/mL                                                 OR                                        > 80% decrease in PCT                                     Discontinue / Do not initiate                                              antibiotics Performed at [facility], [address] [place], [place] [address]   Lactate dehydrogenase     Status: Abnormal   Collection Time: 09/17/18 12:36 AM  Result Value Ref Range   LDH 273 (H) 98 - 192 U/L  Comment: Performed at [facility], [address] [place], [place] [address]  Ferritin     Status: None   Collection Time: 09/17/18 12:36 AM  Result Value Ref Range   Ferritin 72 11 - 307 ng/mL    Comment: Performed at [facility], [address] [place], [place] [address]  Triglycerides     Status: None   Collection Time: 09/17/18 12:36 AM  Result Value Ref Range   Triglycerides 137 <150 mg/dL    Comment: Performed at [facility], [address] [place], [place] [address]  Fibrinogen     Status: Abnormal   Collection Time: 09/17/18 12:36 AM  Result Value Ref Range   Fibrinogen 695 (H) 210 - 475 mg/dL    Comment: Performed at [facility], [address] [place], [place] [address]  Brain natriuretic peptide     Status: None   Collection Time: 09/17/18 12:36 AM  Result Value Ref Range   B Natriuretic Peptide 9.0 0.0 - 100.0 pg/mL    Comment: Performed at [facility], [address] [place], [place] [address]  Troponin I (High Sensitivity)     Status: None   Collection Time: 09/17/18 12:36 AM  Result Value Ref Range   Troponin I (High Sensitivity) 3 <18 ng/L    Comment: (NOTE) Elevated high sensitivity troponin I (hsTnI) values and significant  changes across serial measurements may suggest ACS but many other  chronic and acute conditions are known to elevate hsTnI results.  Refer to the "Links" section for chest pain algorithms and additional  guidance. Performed at [facility], [address] [place], [place] [address]   APTT     Status: Abnormal   Collection Time: 09/17/18 12:36 AM  Result Value Ref Range   aPTT 38 (H) 24 - 36 seconds    Comment:        IF BASELINE  aPTT IS ELEVATED, SUGGEST PATIENT RISK ASSESSMENT BE USED TO DETERMINE APPROPRIATE ANTICOAGULANT THERAPY. Performed at [facility], [address] [place], [place] [address]   Protime-INR     Status: None   Collection Time: 09/17/18 12:36 AM  Result Value Ref Range   Prothrombin Time 13.0 11.4 - 15.2 seconds   INR 1.0 0.8 - 1.2    Comment: (NOTE) INR goal varies based on device and disease states. Performed at [facility], [address] [place], [place] [address]    Dg Chest Port 1 View  Result Date: 09/17/2018 CLINICAL DATA:  Chest pain and shortness of breath. EXAM: PORTABLE CHEST 1 VIEW COMPARISON:  None. FINDINGS: Low lung volumes. Normal heart size and mediastinal contours for technique. Moderate bilateral heterogeneous opacities in the mid lower lung zone predominant distribution. No pleural effusion or pneumothorax. No acute osseous abnormalities. IMPRESSION: Lung volumes. Moderate bilateral heterogeneous opacities suspicious for pneumonia, particularly atypical viral organisms. Electronically Signed   By: Narda RutherfordMelanie  Sanford M.D.   On: 09/17/2018 01:18    Pending Labs Unresulted Labs (From admission, onward)    Start     Ordered   09/17/18 0500  Comprehensive metabolic panel  Daily,   STAT     09/17/18 0337   09/17/18 0500  CBC with Differential/Platelet  Daily,   STAT     09/17/18 0337   09/17/18 0335  Legionella Pneumophila Serogp 1 Ur Ag  Once,   STAT     09/17/18 0337   09/17/18 0335  Strep pneumoniae urinary antigen  Once,  STAT     09/17/18 0337   09/17/18 0335  Culture, blood (routine x 2) Call MD if unable to obtain prior to antibiotics being given  BLOOD CULTURE X 2,   STAT    Comments: If blood cultures drawn in Emergency Department - Do not draw and cancel order    09/17/18 0337   09/17/18 0335  Culture, sputum-assessment  Once,   STAT     09/17/18 0337   09/17/18 0332  HIV antibody (Routine Testing)  Once,   STAT     09/17/18 0337    09/17/18 0036  Blood Culture (routine x 2)  BLOOD CULTURE X 2,   STAT    Question:  Patient immune status  Answer:  Normal   09/17/18 0036   09/17/18 0036  C-reactive protein  Once,   STAT     09/17/18 0036          Vitals/Pain Today's Vitals   09/17/18 0245 09/17/18 0300 09/17/18 0345 09/17/18 0430  BP: (!) 93/47 107/66 110/60 110/66  Pulse: 92 91 91 88  Resp: (!) 33 (!) 28  (!) 31  Temp:      TempSrc:      SpO2: 94% 97% 98% 99%  Weight:      Height:      PainSc:        Isolation Precautions Droplet and Contact precautions  Medications Medications  traMADol (ULTRAM) tablet 50 mg (has no administration in time range)  prenatal vitamin w/FE, FA (PRENATAL 1 + 1) 27-1 MG tablet 1 tablet (has no administration in time range)  cyclobenzaprine (FLEXERIL) tablet 5 mg (has no administration in time range)  0.9 %  sodium chloride infusion (has no administration in time range)  acetaminophen (TYLENOL) tablet 650 mg (has no administration in time range)    Or  acetaminophen (TYLENOL) suppository 650 mg (has no administration in time range)  traZODone (DESYREL) tablet 25 mg (has no administration in time range)  magnesium hydroxide (MILK OF MAGNESIA) suspension 30 mL (has no administration in time range)  ondansetron (ZOFRAN) tablet 4 mg (has no administration in time range)    Or  ondansetron (ZOFRAN) injection 4 mg (has no administration in time range)  cefTRIAXone (ROCEPHIN) 2 g in sodium chloride 0.9 % 100 mL IVPB (has no administration in time range)  azithromycin (ZITHROMAX) tablet 500 mg (has no administration in time range)  guaiFENesin (MUCINEX) 12 hr tablet 600 mg (600 mg Oral Given 09/17/18 0447)  chlorpheniramine-HYDROcodone (TUSSIONEX) 10-8 MG/5ML suspension 5 mL (has no administration in time range)  cholecalciferol (VITAMIN D3) tablet 1,000 Units (has no administration in time range)  zinc sulfate capsule 220 mg (has no administration in time range)  vitamin C  (ASCORBIC ACID) tablet 1,000 mg (has no administration in time range)  famotidine (PEPCID) tablet 20 mg (has no administration in time range)  dexamethasone (DECADRON) injection 6 mg (6 mg Intravenous Not Given 09/17/18 0503)  heparin bolus via infusion 4,000 Units (4,000 Units Intravenous Bolus from Bag 09/17/18 0612)    Followed by  heparin ADULT infusion 100 units/mL (25000 units/261mL sodium chloride 0.45%) (1,200 Units/hr Intravenous New Bag/Given 09/17/18 0614)  acetaminophen (TYLENOL) tablet 650 mg (650 mg Oral Given 09/17/18 0210)  0.9 %  sodium chloride infusion ( Intravenous New Bag/Given 09/17/18 0457)  sodium chloride 0.9 % bolus 1,000 mL (0 mLs Intravenous Stopped 09/17/18 0448)  dexamethasone (DECADRON) injection 10 mg (10 mg Intravenous Given 09/17/18 0312)    Mobility walks  Focused Assessments Pulmonary Assessment Handoff:  Lung sounds: Bilateral Breath Sounds: Diminished L Breath Sounds: Diminished R Breath Sounds: Diminished O2 Device: Nasal Cannula O2 Flow Rate (L/min): 2 L/min      R Recommendations: See Admitting Provider Note  Report given to:   Additional Notes:

## 2018-09-17 NOTE — H&P (Signed)
History and Physical    Kimberly Bentley PYK:[id number] DOB: Apr 24, 1990 DOA: 09/17/2018  PCP: [facility], [facility]   Patient coming from: [facility]  I have personally briefly reviewed patient's old medical records in [place]  CC: SOB, COVID-19 Positive HPI: 28 year old female with no significant medical history presents to the hospital as a transfer from [facility].  Patient states that 1 week ago she developed viral type symptoms.  3 days later she developed diarrhea.  She has had diarrhea for about 6 days.  She had developed fever and chills along with diarrhea.  She works at [employer].  Patient lives alone.  She has had no sick contacts that she knows of.  She was admitted to [facility].  She was seen by critical care.  She was started on remdesivir and Decadron.  Patient had been started on IV heparin due to an elevated d-dimer.  Critical care felt the patient would benefit from intermediate dosing Lovenox.  She was subsequently transferred to [place] due to her COVID positive status.  Patient states she still is short of breath and fatigues quite easily.  Diarrhea has since resolved.   Review of Systems:  Review of Systems  Constitutional: Positive for chills, fever and malaise/fatigue.  HENT:       Change in taste and smell.  Eyes: Negative.   Respiratory: Positive for cough and shortness of breath.   Cardiovascular: Negative.   Gastrointestinal: Positive for abdominal pain and diarrhea. Negative for nausea and vomiting.  Genitourinary: Negative.   Musculoskeletal: Positive for myalgias.  Skin: Negative.   Neurological: Negative.   Endo/Heme/Allergies: Negative.   Psychiatric/Behavioral: Negative.   All other systems reviewed and are negative.   Past Medical History:  Diagnosis Date  . Obesity     No past surgical history on file.   reports that she has never smoked.  She has never used smokeless tobacco. She reports that she does not drink alcohol. No history on file for drug.  Allergies  Allergen Reactions  . Cheese Itching    No family history on file. No Significant family history  Prior to Admission medications   Medication Sig Start Date End Date Taking? Authorizing Provider  dexamethasone (DECADRON) 10 MG/ML injection Inject 0.6 mLs (6 mg total) into the vein daily. 09/18/18   Flora Lipps, MD  enoxaparin (LOVENOX) 80 MG/0.8ML injection Inject 0.65 mLs (65 mg total) into the skin daily. 09/18/18   Flora Lipps, MD    Physical Exam: There were no vitals filed for this visit.  Physical Exam  Nursing note and vitals reviewed. Constitutional: She is oriented to person, place, and time. She appears well-developed and well-nourished. No distress.  HENT:  Head: Normocephalic and atraumatic.  Nose: Nose normal.  Eyes: Pupils are equal, round, and reactive to light. Right eye exhibits no discharge. Left eye exhibits no discharge. No scleral icterus.  Cardiovascular: Normal rate and regular rhythm.  Respiratory: Effort normal. No respiratory distress. She has no wheezes. She has rales.  GI: Soft. Bowel sounds are normal. She exhibits no distension. There is no abdominal tenderness. There is no rebound.  obese  Musculoskeletal:        General: No edema.  Lymphadenopathy:    She has no cervical adenopathy.  Neurological: She is alert and oriented to person, place, and time.  Skin: Skin is warm and dry. She is not diaphoretic.  Psychiatric: She has a normal mood  and affect. Her behavior is normal. Judgment and thought content normal.     Labs on Admission: I have personally reviewed following labs and imaging studies  CBC: Recent Labs  Lab 09/17/18 0036 09/17/18 0559  WBC 6.3 7.1  NEUTROABS 4.9 6.2  HGB 11.5* 11.4*  HCT 35.4* 35.3*  MCV 78.3* 80.0  PLT 266 264   Basic Metabolic Panel: Recent Labs  Lab 09/17/18 0036 09/17/18 0559   NA 137 140  K 3.6 3.7  CL 106 109  CO2 22 22  GLUCOSE 118* 125*  BUN 7 6  CREATININE 0.43* 0.42*  CALCIUM 8.5* 8.1*   GFR: Estimated Creatinine Clearance: 145.8 mL/min (A) (by C-G formula based on SCr of 0.42 mg/dL (L)). Liver Function Tests: Recent Labs  Lab 09/17/18 0036 09/17/18 0559  AST 48* 51*  ALT 44 44  ALKPHOS 97 95  BILITOT 0.4 0.5  PROT 7.5 7.5  ALBUMIN 3.3* 3.1*   No results for input(s): LIPASE, AMYLASE in the last 168 hours. No results for input(s): AMMONIA in the last 168 hours. Coagulation Profile: Recent Labs  Lab 09/17/18 0036  INR 1.0   Cardiac Enzymes: No results for input(s): CKTOTAL, CKMB, CKMBINDEX, TROPONINI in the last 168 hours. BNP (last 3 results) No results for input(s): PROBNP in the last 8760 hours. HbA1C: No results for input(s): HGBA1C in the last 72 hours. CBG: No results for input(s): GLUCAP in the last 168 hours. Lipid Profile: Recent Labs    09/17/18 0036  TRIG 137   Thyroid Function Tests: No results for input(s): TSH, T4TOTAL, FREET4, T3FREE, THYROIDAB in the last 72 hours. Anemia Panel: Recent Labs    09/17/18 0036  FERRITIN 72   Urine analysis:    Component Value Date/Time   COLORURINE YELLOW (A) 11/30/2017 1923   APPEARANCEUR HAZY (A) 11/30/2017 1923   APPEARANCEUR Clear 06/25/2014 1527   LABSPEC 1.008 11/30/2017 1923   LABSPEC 1.015 06/25/2014 1527   PHURINE 6.0 11/30/2017 1923   GLUCOSEU NEGATIVE 11/30/2017 1923   GLUCOSEU Negative 06/25/2014 1527   HGBUR NEGATIVE 11/30/2017 1923   BILIRUBINUR NEGATIVE 11/30/2017 1923   BILIRUBINUR Negative 06/25/2014 1527   KETONESUR NEGATIVE 11/30/2017 1923   PROTEINUR 30 (A) 11/30/2017 1923   NITRITE NEGATIVE 11/30/2017 1923   LEUKOCYTESUR NEGATIVE 11/30/2017 1923   LEUKOCYTESUR Negative 06/25/2014 1527   COVID-19 Labs  Recent Labs    09/17/18 0036  FERRITIN 72  LDH 273*  CRP 13.4*    Lab Results  Component Value Date   SARSCOV2NAA POSITIVE (A)  09/17/2018   Radiological Exams on Admission: I have personally reviewed images Dg Chest Port 1 View  Result Date: 09/17/2018 CLINICAL DATA:  Chest pain and shortness of breath. EXAM: PORTABLE CHEST 1 VIEW COMPARISON:  None. FINDINGS: Low lung volumes. Normal heart size and mediastinal contours for technique. Moderate bilateral heterogeneous opacities in the mid lower lung zone predominant distribution. No pleural effusion or pneumothorax. No acute osseous abnormalities. IMPRESSION: Lung volumes. Moderate bilateral heterogeneous opacities suspicious for pneumonia, particularly atypical viral organisms. Electronically Signed   By: Narda RutherfordMelanie  Sanford M.D.   On: 09/17/2018 01:18    EKG: None  Assessment/Plan Principal Problem:   Acute respiratory failure with hypoxia (HCC) Active Problems:   COVID-19   Morbid obesity with BMI of 45.0-49.9, adult (HCC)    Acute respiratory failure with hypoxia (HCC) Admit to progressive care bed.  Continue supplemental oxygen.  COVID-19 Continue with IV Decadron and IV remdesivir.  Patient without fever now.  Critical care plan was to stop Zithromax on 09/18/2018.  Patient without any fever or chills.  Given the high likelihood that her symptoms are due to COVID and not bacterial pneumonia, will stop azithromycin now.  Morbid obesity with BMI of 45.0-49.9, adult (HCC) Her morbid obesity puts her at increased risk for COVID related complications.  DVT prophylaxis: Lovenox Code Status: Full Code Family Communication: No family available.  Disposition Plan: DC to home after completing Remdesivir and supplemental O2 has been weaned off.  Consults called: none  Admission status: Inpatient, Step Down Unit  Carollee HerterEric Cobi Aldape, DO [facility] 09/17/2018, 10:17 PM

## 2018-09-17 NOTE — Assessment & Plan Note (Signed)
Her morbid obesity puts her at increased risk for COVID related complications.

## 2018-09-17 NOTE — ED Notes (Signed)
Pt ambulated to toilet with minimal assistance.  

## 2018-09-17 NOTE — Assessment & Plan Note (Signed)
Continue with IV Decadron and IV remdesivir.  Patient without fever now.  Critical care plan was to stop Zithromax on 09/18/2018.  Patient without any fever or chills.  Given the high likelihood that her symptoms are due to COVID and not bacterial pneumonia, will stop azithromycin now.

## 2018-09-17 NOTE — H&P (Addendum)
[facility]   PATIENT NAME: Kimberly Bentley    MR#:  [id number]  DATE OF BIRTH:  [birth year]  DATE OF ADMISSION:  09/17/2018  PRIMARY CARE PHYSICIAN: [facility]   REQUESTING/REFERRING PHYSICIAN: Loleta RoseForbach, Cory, MD  CHIEF COMPLAINT:   Chief Complaint  Patient presents with   Shortness of Breath    HISTORY OF PRESENT ILLNESS:  Kimberly Bentley  is a 28 y.o. Hispanic female with no chronic medical problems except for obesity, who presented to the emergency room with acute onset of worsening dyspnea with associated chest congestion and cough occasionally productive of clear sputum with red streaks, body aches and fatigue that started Sunday.  She admitted to loss of taste and smell since Sunday.  She has been having watery diarrhea with her symptoms and yesterday it has been loose.  She admitted to the tactile fever and chills with her symptoms as well as diaphoresis.  She had no recent exposure to COVID-19.  She lives by herself but has seen her mother last couple of days.  She works at the [employer].  Her dyspnea significantly worsened tonight before she came to the emergency room.  She admitted to chest pressure and tightness with her dyspnea and cough.  No nausea or vomiting or abdominal pain or diarrhea.  Her sore throat is been getting better.  She was initially not able to talk with her sore throat that started her symptoms on Sunday.   On presentation to the emergency room, blood pressure was initially 130/77 and later 93/47 with a respiratory rate of 34, pulse of 111 and pulse currently 97% on 2 L O2 by nasal cannula.  Labs revealed an elevated LDH of 273 and normal ferritin of 72 and normal BNP of 9 lactic acid of 1.1 with procalcitonin less than 0.1.  CBC showed WBC 6.3 and hemoglobin 11.5 with hematocrit 35.4 and platelets 266.  Neutrophils were 79% ANC was 4.9 and ALC 1 Luking ANC/ALC ratio  4.9.  Fibrin derivatives d-dimer was slightly elevated at 539.7 and fibrinogen was elevated at 695.  Portable chest x-ray showed multifocal bilateral pneumonia consistent with atypical viral pneumonia.  Urine pregnancy test was negative.  COVID-19 test came back positive.  The patient was initially placed on droplet and contact precautions.  Blood cultures were drawn.  Initial EKG showed sinus tachycardia with a rate of 109 with low voltage QRS.  The patient was given IV Rocephin and Zithromax as well as 650 mg p.o. Tylenol, 1 L bolus of normal saline followed by 100 mg and 10 mg of IV Decadron.  She will be admitted to a medical monitored bed for further evaluation and management.   PAST MEDICAL HISTORY:   Past Medical History:  Diagnosis Date   Obesity     PAST SURGICAL HISTORY:  Dilatation and curettage for a miscarriage.   SOCIAL HISTORY:   Social History   Tobacco Use   Smoking status: Never Smoker   Smokeless tobacco: Never Used  Substance Use Topics   Alcohol use: No    FAMILY HISTORY:  Positive for diabetes mellitus in her father and brother as well as CVA in her father  DRUG ALLERGIES:   Allergies  Allergen Reactions   Cheese Itching    REVIEW OF SYSTEMS:   ROS As per history of present illness. All pertinent systems were reviewed above. Constitutional,  HEENT, cardiovascular, respiratory, GI, GU, musculoskeletal, neuro, psychiatric, endocrine,  integumentary and hematologic systems were reviewed and are otherwise  negative/unremarkable except for positive findings mentioned above in the HPI.   MEDICATIONS AT HOME:   Prior to Admission medications   Medication Sig Start Date End Date Taking? Authorizing Provider  cyclobenzaprine (FLEXERIL) 5 MG tablet Take 1 tablet (5 mg total) by mouth 3 (three) times daily as needed for muscle spasms. Patient not taking: Reported on 09/17/2018 03/04/18   Menshew, Dannielle Karvonen, PA-C  diazepam (VALIUM) 5 MG tablet Take  1 tablet (5 mg total) by mouth every 8 (eight) hours as needed for muscle spasms. Patient not taking: Reported on 09/17/2018 03/06/18   Earleen Newport, MD  Prenatal Vit-Fe Fumarate-FA (PRENATAL MULTIVITAMIN) TABS tablet Take 1 tablet by mouth daily.    [provider]  traMADol (ULTRAM) 50 MG tablet Take 1 tablet (50 mg total) by mouth every 6 (six) hours as needed. Patient not taking: Reported on 09/17/2018 03/06/18 03/06/19  Earleen Newport, MD      VITAL SIGNS:  Blood pressure (!) 93/47, pulse 92, temperature (!) 101 F (38.3 C), temperature source Oral, resp. rate (!) 33, height 5\' 4"  (1.626 m), weight 127 kg, SpO2 94 %, unknown if currently breastfeeding.  PHYSICAL EXAMINATION:  Physical Exam  GENERAL:  28 y.o.-year-old Hispanic female patient lying in the bed with no acute distress.  EYES: Pupils equal, round, reactive to light and accommodation. No scleral icterus. Extraocular muscles intact.  HEENT: Head atraumatic, normocephalic. Oropharynx and nasopharynx clear.  NECK:  Supple, no jugular venous distention. No thyroid enlargement, no tenderness.  LUNGS: Normal breath sounds bilaterally, no wheezing, rales,rhonchi or crepitation. No use of accessory muscles of respiration.  CARDIOVASCULAR: Regular rate and rhythm, S1, S2 normal. No murmurs, rubs, or gallops.  ABDOMEN: Soft, nondistended, nontender. Bowel sounds present. No organomegaly or mass.  EXTREMITIES: No pedal edema, cyanosis, or clubbing.  NEUROLOGIC: Cranial nerves II through XII are intact. Muscle strength 5/5 in all extremities. Sensation intact. Gait not checked.  PSYCHIATRIC: The patient is alert and oriented x 3.  Normal affect and good eye contact. SKIN: No obvious rash, lesion, or ulcer.   LABORATORY PANEL:   CBC Recent Labs  Lab 09/17/18 0036  WBC 6.3  HGB 11.5*  HCT 35.4*  PLT 266    ------------------------------------------------------------------------------------------------------------------  Chemistries  Recent Labs  Lab 09/17/18 0036  NA 137  K 3.6  CL 106  CO2 22  GLUCOSE 118*  BUN 7  CREATININE 0.43*  CALCIUM 8.5*  AST 48*  ALT 44  ALKPHOS 97  BILITOT 0.4   ------------------------------------------------------------------------------------------------------------------  Cardiac Enzymes No results for input(s): TROPONINI in the last 168 hours. ------------------------------------------------------------------------------------------------------------------  RADIOLOGY:  Dg Chest Port 1 View  Result Date: 09/17/2018 CLINICAL DATA:  Chest pain and shortness of breath. EXAM: PORTABLE CHEST 1 VIEW COMPARISON:  None. FINDINGS: Low lung volumes. Normal heart size and mediastinal contours for technique. Moderate bilateral heterogeneous opacities in the mid lower lung zone predominant distribution. No pleural effusion or pneumothorax. No acute osseous abnormalities. IMPRESSION: Lung volumes. Moderate bilateral heterogeneous opacities suspicious for pneumonia, particularly atypical viral organisms. Electronically Signed   By: Keith Rake M.D.   On: 09/17/2018 01:18      IMPRESSION AND PLAN:   1.  COVID-19. -The patient will be admitted to a medical monitored bed and placed on isolation with droplet and contact precautions. -We will follow daily CRP and CBC with differential to assess for ANC/ALC ratio, CMP to follow renal and liver  functions as well as HS troponin. -For the possibility of bacterial coinfection with multifocal pneumonia, will obtain sputum culture and sensitivity, Legionella and strep pneumonia urine antigens and mycoplasma antibodY IgM and follow-up blood cultures. -The patient will be empirically placed on IV Rocephin and Zithromax pending blood cultures results. -We will continue IV steroid therapy with IV Decadron 6 mg  daily. -Given elevated d-dimer and for high risk of coagulopathy, will place the patient on IV heparin bolus & drip. -The patient will be given daily vitamin C, vitamin D and zinc sulfate. -The patient will be assessed for IV Remdisivir especially if she develops associated hypoxia. - Given the patient's diarrhea we will obtain stool studies.  2.  Multifocal pneumonia, likely secondary to COVID-19.  Management as above.  #3  3.  Sepsis likely secondary to COVID-19.  Manifested by tachypnea and hypotension and reported fever.  Management as above.  Will follow blood and sputum culture.  We will continue IV Rocephin and Zithromax.  4.  DVT prophylaxis.  The patient will be on IV heparin drip especially given high risk of COVID-19 induced coagulopathy and slightly elevated d-dimer.   All the records are reviewed and case discussed with ED provider. The plan of care was discussed in details with the patient (and family). I answered all questions. The patient agreed to proceed with the above mentioned plan. Further management will depend upon hospital course.   CODE STATUS: Full code  TOTAL TIME TAKING CARE OF THIS PATIENT: 55 minutes.    Hannah BeatJan A Josecarlos Harriott M.D on 09/17/2018 at 3:44 AM  Pager - [phone]  After 6pm go to [email or web address] - Scientist, research (life sciences)password [email or web address] [email or web address]  [facility] [place] [facility]  Office  [phone]  CC: Primary care physician; [facility]   Note: This dictation was prepared with Dragon dictation along with smaller phrase technology. Any transcriptional errors that result from this process are unintentional.

## 2018-09-17 NOTE — Discharge Summary (Signed)
Name: Chrys RacerJaqueline Martinez Cueto MRN: [id number] DOB: 1991/01/27     CONSULTATION DATE: 09/17/2018  CHIEF COMPLAINT:  Severe shortness of breath   HISTORY OF PRESENT ILLNESS:   28 y.o. Hispanic female with no known chronic disease with the exception of obesity presents to ED with severe SOB. Patients symptoms started last Sunday, 9 days ago, and have progressivly worsened. She reports fever, chills, chest pain and congestion with productive cough, body aches, and fatigue. She has experiencing bouts of watery diarrhea. She has no known exposure to COVID-19. She lives alone and works at [employer]. ED performed labs and CXR. Patient COVID-19 positive. Patient transferred to ICU in respiratory distress.   PAST MEDICAL HISTORY :   has a past medical history of Obesity.  has no past surgical history on file.   Allergies  Allergen Reactions  . Cheese Itching    FAMILY HISTORY:  family history is not on file. SOCIAL HISTORY:  reports that she has never smoked. She has never used smokeless tobacco. She reports that she does not drink alcohol.     VITAL SIGNS: Temp:  [98.6 F (37 C)-101 F (38.3 C)] 98.6 F (37 C) (07/21 1155) Pulse Rate:  [77-120] 79 (07/21 1500) Resp:  [19-37] 27 (07/21 1500) BP: (93-155)/(47-93) 119/74 (07/21 1155) SpO2:  [93 %-100 %] 100 % (07/21 1500) Weight:  [127 kg-132.6 kg] 132.6 kg (07/21 0836)   I/O last 3 completed shifts: In: 1164 [IV Piggyback:1164] Out: -  Total I/O In: 92.7 [I.V.:92.7] Out: -    SpO2: 100 % O2 Flow Rate (L/min): 2 L/min   Physical Examination:  GENERAL: Comfortable on [place] O2 HEAD: Normocephalic, atraumatic.  EYES: Pupils equal, round, reactive to light.  No scleral icterus.  MOUTH: Moist mucosal membrane. NECK: Supple. No JVD.  PULMONARY: bibasilar crackles CARDIOVASCULAR: S1 and S2. Regular rate and rhythm. No murmurs, rubs, or gallops.  GASTROINTESTINAL: Soft, nontender, -distended. No masses. Positive bowel  sounds. No hepatosplenomegaly.  MUSCULOSKELETAL: No swelling, clubbing, or edema.  NEUROLOGIC: obtunded SKIN:intact,warm,dry  CXR: patchy B infiltrates  MEDICATIONS: I have reviewed all medications and confirmed regimen as documented   CULTURE RESULTS   SARS CoV-2 PCR positive Resp culture 07/21 >>   ANITMICROBIALS: Azithro 07/21 >>  Remdesivir 07/21 >>    IMAGING    Dg Chest Port 1 View  Result Date: 09/17/2018 CLINICAL DATA:  Chest pain and shortness of breath. EXAM: PORTABLE CHEST 1 VIEW COMPARISON:  None. FINDINGS: Low lung volumes. Normal heart size and mediastinal contours for technique. Moderate bilateral heterogeneous opacities in the mid lower lung zone predominant distribution. No pleural effusion or pneumothorax. No acute osseous abnormalities. IMPRESSION: Lung volumes. Moderate bilateral heterogeneous opacities suspicious for pneumonia, particularly atypical viral organisms. Electronically Signed   By: Narda RutherfordMelanie  Sanford M.D.   On: 09/17/2018 01:18    ASSESSMENT AND PLAN SYNOPSIS  ACUTE Hypoxic Respiratory Failure due to COVID-19 Supplemental oxygen as needed to maintain SPO2 >90% ICU/SDU monitoring Dexamethasone 6 mg IV daily x10 days (through 7/30) Initiate Remdesivir 07/21 x 5 days (through 7/25)  COVID-19 LABS TO TRACK LDH, CRP, D-dimer, fibrinogen, LFTs  CARDIAC ICU monitoring  ID Monitor temp, WBC count Micro and abx as above  If PCT remains low 07/22, will DC azithromycin   NUTRITIONAL STATUS Regular diet  ENDO Monitor glucose on chemistry panels Consider SSI for glu > 180  RENAL Monitor BMET intermittently Monitor I/Os Correct electrolytes as indicated   HEME - elevated D-dimer This is a common  finding in COVID-19.  This does not indicate necessarily VTE.  It does persuade me to use higher dose DVT prophylaxis. -Discontinue heparin infusion -DVT prophylaxis with enoxaparin 0.5 mg/kg daily     Current Facility-Administered  Medications:  .  acetaminophen (TYLENOL) tablet 650 mg, 650 mg, Oral, Q6H PRN **OR** [DISCONTINUED] acetaminophen (TYLENOL) suppository 650 mg, 650 mg, Rectal, Q6H PRN, Mansy, Jan A, MD .  Derrill Memo ON 09/18/2018] azithromycin (ZITHROMAX) tablet 500 mg, 500 mg, Oral, Daily, Mansy, Jan A, MD .  Derrill Memo ON 09/18/2018] Chlorhexidine Gluconate Cloth 2 % PADS 6 each, 6 each, Topical, Daily, Wilhelmina Mcardle, MD .  chlorpheniramine-HYDROcodone (TUSSIONEX) 10-8 MG/5ML suspension 5 mL, 5 mL, Oral, Q12H PRN, Mansy, Jan A, MD, 5 mL at 09/17/18 1422 .  cholecalciferol (VITAMIN D3) tablet 1,000 Units, 1,000 Units, Oral, Daily, Mansy, Arvella Merles, MD, 1,000 Units at 09/17/18 1157 .  cyclobenzaprine (FLEXERIL) tablet 5 mg, 5 mg, Oral, TID PRN, Mansy, Jan A, MD .  dexamethasone (DECADRON) injection 6 mg, 6 mg, Intravenous, Q24H, Mansy, Jan A, MD .  enoxaparin (LOVENOX) injection 65 mg, 65 mg, Subcutaneous, Q24H, Wilhelmina Mcardle, MD, 65 mg at 09/17/18 1422 .  guaiFENesin (MUCINEX) 12 hr tablet 600 mg, 600 mg, Oral, BID PRN, Wilhelmina Mcardle, MD .  magnesium hydroxide (MILK OF MAGNESIA) suspension 30 mL, 30 mL, Oral, Daily PRN, Mansy, Jan A, MD .  [DISCONTINUED] ondansetron (ZOFRAN) tablet 4 mg, 4 mg, Oral, Q6H PRN **OR** ondansetron (ZOFRAN) injection 4 mg, 4 mg, Intravenous, Q6H PRN, Mansy, Jan A, MD .  prenatal vitamin w/FE, FA (PRENATAL 1 + 1) 27-1 MG tablet 1 tablet, 1 tablet, Oral, Daily, Mansy, Jan A, MD, 1 tablet at 09/17/18 0857 .  [COMPLETED] remdesivir 200 mg in sodium chloride 0.9 % 250 mL IVPB, 200 mg, Intravenous, Once, Last Rate: 500 mL/hr at 09/17/18 1430, 200 mg at 09/17/18 1430 **FOLLOWED BY** [START ON 09/18/2018] remdesivir 100 mg in sodium chloride 0.9 % 250 mL IVPB, 100 mg, Intravenous, Q24H, Wilhelmina Mcardle, MD .  traMADol Veatrice Bourbon) tablet 50 mg, 50 mg, Oral, Q6H PRN, Mansy, Jan A, MD .  traZODone (DESYREL) tablet 25 mg, 25 mg, Oral, QHS PRN, Mansy, Jan A, MD .  vitamin C (ASCORBIC ACID) tablet 1,000 mg,  1,000 mg, Oral, Daily, Mansy, Jan A, MD, 1,000 mg at 09/17/18 0857 .  zinc sulfate capsule 220 mg, 220 mg, Oral, Daily, Mansy, Jan A, MD, 220 mg at 09/17/18 1157  REMDISIVIR 60 mg qday    Corrin Parker, M.D.  [facility]monary [facility] Critical [facility] Medicine  Medical Director [place] Director [facility] Cardio-Pulmonary Department

## 2018-09-17 NOTE — ED Triage Notes (Signed)
Pt c/o chest pain and shortness of breath since this past Sunday. Pt presents febrile and tachycardiac.

## 2018-09-17 NOTE — Assessment & Plan Note (Signed)
Admit to progressive care bed.  Continue supplemental oxygen.

## 2018-09-17 NOTE — Progress Notes (Signed)
CODE SEPSIS - PHARMACY COMMUNICATION  **Broad Spectrum Antibiotics should be administered within 1 hour of Sepsis diagnosis**  Time Code Sepsis Called/Page Received: @ 0242  Antibiotics Ordered: ceftriaxone and azithromycin  Time of 1st antibiotic administration: @ [id number]  Additional action taken by pharmacy: [place], PharmD, BCPS Clinical Pharmacist 09/17/2018 3:32 AM

## 2018-09-17 NOTE — Progress Notes (Signed)
Name: Kimberly Bentley MRN: [id number] DOB: Jun 08, 1990     CONSULTATION DATE: 09/17/2018  CHIEF COMPLAINT:  Severe shortness of breath   HISTORY OF PRESENT ILLNESS:   28 y.o. Hispanic female with no known chronic disease with the exception of obesity presents to ED with severe SOB. Patients symptoms started last Sunday, 9 days ago, and have progressivly worsened. She reports fever, chills, chest pain and congestion with productive cough, body aches, and fatigue. She has experiencing bouts of watery diarrhea. She has no known exposure to COVID-19. She lives alone and works at [employer]. ED performed labs and CXR. Patient COVID-19 positive. Patient transferred to ICU in respiratory distress.   PAST MEDICAL HISTORY :   has a past medical history of Obesity.  has no past surgical history on file.   Allergies  Allergen Reactions  . Cheese Itching    FAMILY HISTORY:  family history is not on file. SOCIAL HISTORY:  reports that she has never smoked. She has never used smokeless tobacco. She reports that she does not drink alcohol.  REVIEW OF SYSTEMS:   Unable to obtain due to critical illness   VITAL SIGNS: Temp:  [98.7 F (37.1 C)-101 F (38.3 C)] 98.7 F (37.1 C) (07/21 0836) Pulse Rate:  [77-120] 88 (07/21 0836) Resp:  [19-36] 35 (07/21 0836) BP: (93-155)/(47-93) 123/79 (07/21 0836) SpO2:  [93 %-100 %] 100 % (07/21 0836) Weight:  [408 kg] 127 kg (07/21 0040)   I/O last 3 completed shifts: In: 1164 [IV Piggyback:1164] Out: -  No intake/output data recorded.   SpO2: 100 % O2 Flow Rate (L/min): 2 L/min   Physical Examination:  GENERAL: Comfortable on [place] O2 HEAD: Normocephalic, atraumatic.  EYES: Pupils equal, round, reactive to light.  No scleral icterus.  MOUTH: Moist mucosal membrane. NECK: Supple. No JVD.  PULMONARY: bibasilar crackles CARDIOVASCULAR: S1 and S2. Regular rate and rhythm. No murmurs, rubs, or gallops.  GASTROINTESTINAL: Soft,  nontender, -distended. No masses. Positive bowel sounds. No hepatosplenomegaly.  MUSCULOSKELETAL: No swelling, clubbing, or edema.  NEUROLOGIC: obtunded SKIN:intact,warm,dry  CXR: patchy B infiltrates  MEDICATIONS: I have reviewed all medications and confirmed regimen as documented   CULTURE RESULTS   SARS CoV-2 PCR positive Resp culture 07/21 >>   ANITMICROBIALS: Azithro 07/21 >>  Remdesivir 07/21 >>    IMAGING    Dg Chest Port 1 View  Result Date: 09/17/2018 CLINICAL DATA:  Chest pain and shortness of breath. EXAM: PORTABLE CHEST 1 VIEW COMPARISON:  None. FINDINGS: Low lung volumes. Normal heart size and mediastinal contours for technique. Moderate bilateral heterogeneous opacities in the mid lower lung zone predominant distribution. No pleural effusion or pneumothorax. No acute osseous abnormalities. IMPRESSION: Lung volumes. Moderate bilateral heterogeneous opacities suspicious for pneumonia, particularly atypical viral organisms. Electronically Signed   By: Keith Rake M.D.   On: 09/17/2018 01:18    ASSESSMENT AND PLAN SYNOPSIS  ACUTE Hypoxic Respiratory Failure due to COVID-19 Supplemental oxygen as needed to maintain SPO2 >90% ICU/SDU monitoring Dexamethasone 6 mg IV daily x10 days (through 7/30) Initiate Remdesivir 07/21 x 5 days (through 7/25)  COVID-19 LABS TO TRACK LDH, CRP, D-dimer, fibrinogen, LFTs  CARDIAC ICU monitoring  ID Monitor temp, WBC count Micro and abx as above  If PCT remains low 07/22, will DC azithromycin   NUTRITIONAL STATUS Regular diet  ENDO Monitor glucose on chemistry panels Consider SSI for glu > 180  RENAL Monitor BMET intermittently Monitor I/Os Correct electrolytes as indicated   HEME -  elevated D-dimer This is a common finding in COVID-19.  This does not indicate necessarily VTE.  It does persuade me to use higher dose DVT prophylaxis. -Discontinue heparin infusion -DVT prophylaxis with enoxaparin 0.5 mg/kg  daily  Billy Fischeravid , MD PCCM service Mobile [phone] Pager [phone] 09/17/2018 12:46 PM

## 2018-09-17 NOTE — Progress Notes (Signed)
MEDICATION RELATED CONSULT NOTE - INITIAL   Pharmacy Consult for Remdesivir Indication: COVID-19  Assessment: 28 yo F presents with SOB. COVID-19 positive. On [place] and CXR positive. ALT ok.   Plan:  Remdesivir 200mg  given at [facility] around 1600 today Start remdesivir 100mg  IV x 4 days tomorrow Monitor clinical picture and ALT  Elenor Quinones, PharmD, BCPS, BCIDP Clinical Pharmacist 09/17/2018 9:51 PM

## 2018-09-17 NOTE — Subjective & Objective (Signed)
CC: SOB, COVID-19 Positive HPI: 28 year old female with no significant medical history presents to the hospital as a transfer from [facility].  Patient states that 1 week ago she developed viral type symptoms.  3 days later she developed diarrhea.  She has had diarrhea for about 6 days.  She had developed fever and chills along with diarrhea.  She works at [employer].  Patient lives alone.  She has had no sick contacts that she knows of.  She was admitted to [facility].  She was seen by critical care.  She was started on remdesivir and Decadron.  Patient had been started on IV heparin due to an elevated d-dimer.  Critical care felt the patient would benefit from intermediate dosing Lovenox.  She was subsequently transferred to [facility] due to her COVID positive status.  Patient states she still is short of breath and fatigues quite easily.  Diarrhea has since resolved.

## 2018-09-17 NOTE — ED Provider Notes (Signed)
[facility] Emergency Department Provider Note  ____________________________________________   First MD Initiated Contact with Patient 09/17/18 0041     (approximate)  I have reviewed the triage vital signs and the nursing notes.   HISTORY  Chief Complaint Shortness of Breath  Level 5 caveat:  history/ROS limited by acute/critical illness  HPI Kimberly Bentley is a 28 y.o. female with no chronic medical issues except for obesity who presents for a days of worsening shortness of breath, fever, body aches, sore throat, congestion, and cough.  She has no family contacts with COVID-19 and lives by herself, but she has seen her mom within the last couple of days.  She works at the [employer] and thus comes in contact with the public.  By tonight her shortness of breath was severe and upon arrival to the ED she was taken immediately to a negative pressure room given concerns for COVID-19.  She reports fever and chills, chest pressure and tightness, cough, shortness of breath.  She denies abdominal pain and diarrhea.  Her sore throat has improved but the rest of the symptoms of gotten worse.  They are currently severe.  Nothing in particular makes them better and exertion makes her symptoms worse.         Past Medical History:  Diagnosis Date  . Obesity     There are no active problems to display for this patient.   History reviewed. No pertinent surgical history.  Prior to Admission medications   Medication Sig Start Date End Date Taking? Authorizing Provider  cyclobenzaprine (FLEXERIL) 5 MG tablet Take 1 tablet (5 mg total) by mouth 3 (three) times daily as needed for muscle spasms. 03/04/18   Menshew, Charlesetta IvoryJenise V Bacon, PA-C  diazepam (VALIUM) 5 MG tablet Take 1 tablet (5 mg total) by mouth every 8 (eight) hours as needed for muscle spasms. 03/06/18   Emily FilbertWilliams, Jonathan E, MD  Prenatal Vit-Fe Fumarate-FA (PRENATAL MULTIVITAMIN) TABS tablet Take  1 tablet by mouth daily.    [provider]  traMADol (ULTRAM) 50 MG tablet Take 1 tablet (50 mg total) by mouth every 6 (six) hours as needed. 03/06/18 03/06/19  Emily FilbertWilliams, Jonathan E, MD    Allergies Cheese  History reviewed. No pertinent family history.  Social History Social History   Tobacco Use  . Smoking status: Never Smoker  . Smokeless tobacco: Never Used  Substance Use Topics  . Alcohol use: No  . Drug use: Not on file    Review of Systems Level 5 caveat:  history/ROS limited by acute/critical illness  Constitutional: +fever/chills Eyes: No visual changes. ENT: +sore throat. Cardiovascular: Denies chest pain. Respiratory: Shortness of breath and cough Gastrointestinal: No abdominal pain.  No nausea, no vomiting.  No diarrhea.  No constipation. Genitourinary: Negative for dysuria. Musculoskeletal: Myalgias and generalized body aches.  Negative for neck pain.  Negative for back pain. Integumentary: Negative for rash. Neurological: Negative for headaches, focal weakness or numbness.   ____________________________________________   PHYSICAL EXAM:  VITAL SIGNS: ED Triage Vitals  Enc Vitals Group     BP 09/17/18 0032 (!) 155/86     Pulse Rate 09/17/18 0032 (!) 115     Resp 09/17/18 0032 (!) 31     Temp 09/17/18 0032 (!) 101 F (38.3 C)     Temp Source 09/17/18 0032 Oral     SpO2 09/17/18 0030 93 %     Weight 09/17/18 0040 127 kg (280 lb)  Height 09/17/18 0040 1.626 m (5\' 4" )     Head Circumference --      Peak Flow --      Pain Score 09/17/18 0032 8     Pain Loc --      Pain Edu? --      Excl. in GC? --     Constitutional: Alert and oriented.  Appears ill but nontoxic.  Mild respiratory distress but relatively minimal. Eyes: Conjunctivae are normal.  Head: Atraumatic. Nose: No congestion/rhinnorhea. Mouth/Throat: Mucous membranes are moist. Neck: No stridor.  No meningeal signs.   Cardiovascular: Tachycardia, regular rhythm. Good peripheral  circulation. Grossly normal heart sounds. Respiratory: Increased respiratory rate but no accessory muscle usage on 2 L of oxygen by nasal cannula.  Minimal coarse breath sounds in the bases, no wheezing. Gastrointestinal: Obese.  Soft and nontender. No distention.  Musculoskeletal: No lower extremity tenderness nor edema. No gross deformities of extremities. Neurologic:  Normal speech and language. No gross focal neurologic deficits are appreciated.  Skin:  Skin is warm, dry and intact. No rash noted. Psychiatric: Mood and affect are normal. Speech and behavior are normal.  ____________________________________________   LABS (all labs ordered are listed, but only abnormal results are displayed)  Labs Reviewed  SARS CORONAVIRUS 2 ([facility] ORDER, PERFORMED IN [place] [facility]) - Abnormal; Notable for the following components:      Result Value   SARS Coronavirus 2 POSITIVE (*)    All other components within normal limits  CBC WITH DIFFERENTIAL/PLATELET - Abnormal; Notable for the following components:   Hemoglobin 11.5 (*)    HCT 35.4 (*)    MCV 78.3 (*)    MCH 25.4 (*)    All other components within normal limits  COMPREHENSIVE METABOLIC PANEL - Abnormal; Notable for the following components:   Glucose, Bld 118 (*)    Creatinine, Ser 0.43 (*)    Calcium 8.5 (*)    Albumin 3.3 (*)    AST 48 (*)    All other components within normal limits  FIBRIN DERIVATIVES D-DIMER ([facility] ONLY) - Abnormal; Notable for the following components:   Fibrin derivatives D-dimer ([facility]) 539.70 (*)    All other components within normal limits  LACTATE DEHYDROGENASE - Abnormal; Notable for the following components:   LDH 273 (*)    All other components within normal limits  FIBRINOGEN - Abnormal; Notable for the following components:   Fibrinogen 695 (*)    All other components within normal limits  CULTURE, BLOOD (ROUTINE X 2)  CULTURE, BLOOD (ROUTINE X 2)  LACTIC ACID, PLASMA  PROCALCITONIN   FERRITIN  TRIGLYCERIDES  BRAIN NATRIURETIC PEPTIDE  C-REACTIVE PROTEIN  POC URINE PREG, ED   ____________________________________________  EKG  ED ECG REPORT I, Kimberly Bentley Hibberd, the attending physician, personally viewed and interpreted this ECG.  Date: 09/17/2018 EKG Time: 00: 36 Rate: 109 Rhythm: Sinus tachycardia QRS Axis: normal Intervals: normal ST/T Wave abnormalities: normal Narrative Interpretation: no evidence of acute ischemia  ____________________________________________  RADIOLOGY I, Kimberly Roseory Callista Hoh, personally viewed and evaluated these images (plain radiographs) as part of my medical decision making, as well as reviewing the written report by the radiologist.  ED MD interpretation: Bilateral infiltrates concerning for COVID-19.  Official radiology report(s): Dg Chest Port 1 View  Result Date: 09/17/2018 CLINICAL DATA:  Chest pain and shortness of breath. EXAM: PORTABLE CHEST 1 VIEW COMPARISON:  None. FINDINGS: Low lung volumes. Normal heart size and mediastinal contours for technique. Moderate bilateral heterogeneous opacities in  the mid lower lung zone predominant distribution. No pleural effusion or pneumothorax. No acute osseous abnormalities. IMPRESSION: Lung volumes. Moderate bilateral heterogeneous opacities suspicious for pneumonia, particularly atypical viral organisms. Electronically Signed   By: Keith Rake M.D.   On: 09/17/2018 01:18    ____________________________________________   PROCEDURES   Procedure(s) performed (including Critical Care):  .Critical Care Performed by: Hinda Kehr, MD Authorized by: Hinda Kehr, MD   Critical care provider statement:    Critical care time (minutes):  30   Critical care time was exclusive of:  Separately billable procedures and treating other patients   Critical care was necessary to treat or prevent imminent or life-threatening deterioration of the following conditions:  Respiratory failure and  sepsis   Critical care was time spent personally by me on the following activities:  Development of treatment plan with patient or surrogate, discussions with consultants, evaluation of patient's response to treatment, examination of patient, obtaining history from patient or surrogate, ordering and performing treatments and interventions, ordering and review of laboratory studies, ordering and review of radiographic studies, pulse oximetry, re-evaluation of patient's condition and review of old charts     ____________________________________________   INITIAL IMPRESSION / MDM / [place] / ED COURSE  As part of my medical decision making, I reviewed the following data within the [place] notes reviewed and incorporated, Labs reviewed , EKG interpreted , Old chart reviewed, Radiograph reviewed , Discussed with admitting physician  and Notes from prior ED visits   Differential diagnosis includes, but is not limited to, COVID-19, community-acquired pneumonia, CHF, COPD/asthma.  The patient's vital signs have been stable during the emergency department visit with mild tachycardia which improved slightly as her fever was treated with Tylenol.  She has remained tachypneic and initially her oxygen levels in the low 90s but she was in significant distress that improved with 2 L of oxygen by nasal cannula.  Her blood pressure has dropped slightly since coming to the emergency department but she is currently at rest.  Her most recent blood pressure was 93/47 and I ordered a a liter of fluid normal saline bolus.  Upon her arrival I ordered all of the standard preadmission work-up for suspected COVID-19.  Her coronavirus swab did come back positive.  As anticipated, her d-dimer is up slightly, LDH elevated, fibrinogen elevated.  Procalcitonin and lactic acid are within normal limits.  BNP is normal.  Basic metabolic panel generally reassuring with no significant or acute  abnormalities.  Given the patient's worsening status after 8 days, oxygen dependence, bilateral pulmonary infiltrates on chest x-ray, etc., I feel the patient would be appropriate for admission.  [facility] is full at this time so we will admit her to [facility].  I am giving ceftriaxone 2 g IV, azithromycin 500 mg IV, Decadron 10 mg IV (as per the current policy for giving steroids at [facility]), and fluids as listed above.  I will contact the hospitalist team for admission.      Clinical Course as of Sep 16 313  Tue Sep 17, 2018  0226 SARS Coronavirus 2(!): POSITIVE [CF]  0307 I discussed the case via [facility] secure chat text with Dr. Sidney Ace with the hospitalist service who will admit.   [CF]    Clinical Course User Index [CF] Hinda Kehr, MD     ____________________________________________  FINAL CLINICAL IMPRESSION(S) / ED DIAGNOSES  Final diagnoses:  Acute respiratory disease due to COVID-19 virus  Sepsis, due  to unspecified organism, unspecified whether acute organ dysfunction present (HCC)  Pneumonia of both lungs due to infectious organism, unspecified part of lung     MEDICATIONS GIVEN DURING THIS VISIT:  Medications  0.9 %  sodium chloride infusion (has no administration in time range)  cefTRIAXone (ROCEPHIN) 2 g in sodium chloride 0.9 % 100 mL IVPB (has no administration in time range)  azithromycin (ZITHROMAX) 500 mg in sodium chloride 0.9 % 250 mL IVPB (has no administration in time range)  sodium chloride 0.9 % bolus 1,000 mL (has no administration in time range)  acetaminophen (TYLENOL) tablet 650 mg (650 mg Oral Given 09/17/18 0210)  dexamethasone (DECADRON) injection 10 mg (10 mg Intravenous Given 09/17/18 [id number])     ED Discharge Orders    None      *Please note:  Chrys RacerJaqueline Martinez Bentley was evaluated in Emergency Department on 09/17/2018 for the symptoms described in the history of present illness. She was evaluated in the context of the global  COVID-19 pandemic, which necessitated consideration that the patient might be at risk for infection with the SARS-CoV-2 virus that causes COVID-19. Institutional protocols and algorithms that pertain to the evaluation of patients at risk for COVID-19 are in a state of rapid change based on information released by regulatory bodies including the CDC and federal and state organizations. These policies and algorithms were followed during the patient's care in the ED.  Some ED evaluations and interventions may be delayed as a result of limited staffing during the pandemic.*  Note:  This document was prepared using Dragon voice recognition software and may include unintentional dictation errors.   Kimberly RoseForbach, Shellye Zandi, MD 09/17/18 [phone] [id number]

## 2018-09-18 ENCOUNTER — Encounter (HOSPITAL_COMMUNITY): Payer: Self-pay

## 2018-09-18 LAB — CBC WITH DIFFERENTIAL/PLATELET
Abs Immature Granulocytes: 0.02 10*3/uL (ref 0.00–0.07)
Basophils Absolute: 0 10*3/uL (ref 0.0–0.1)
Basophils Relative: 0 %
Eosinophils Absolute: 0 10*3/uL (ref 0.0–0.5)
Eosinophils Relative: 0 %
HCT: 34.5 % — ABNORMAL LOW (ref 36.0–46.0)
Hemoglobin: 10.7 g/dL — ABNORMAL LOW (ref 12.0–15.0)
Immature Granulocytes: 0 %
Lymphocytes Relative: 21 %
Lymphs Abs: 1.3 10*3/uL (ref 0.7–4.0)
MCH: 25.4 pg — ABNORMAL LOW (ref 26.0–34.0)
MCHC: 31 g/dL (ref 30.0–36.0)
MCV: 81.9 fL (ref 80.0–100.0)
Monocytes Absolute: 0.5 10*3/uL (ref 0.1–1.0)
Monocytes Relative: 8 %
Neutro Abs: 4.4 10*3/uL (ref 1.7–7.7)
Neutrophils Relative %: 71 %
Platelets: 294 10*3/uL (ref 150–400)
RBC: 4.21 MIL/uL (ref 3.87–5.11)
RDW: 14.6 % (ref 11.5–15.5)
WBC: 6.3 10*3/uL (ref 4.0–10.5)
nRBC: 0 % (ref 0.0–0.2)

## 2018-09-18 LAB — COMPREHENSIVE METABOLIC PANEL
ALT: 50 U/L — ABNORMAL HIGH (ref 0–44)
AST: 48 U/L — ABNORMAL HIGH (ref 15–41)
Albumin: 2.7 g/dL — ABNORMAL LOW (ref 3.5–5.0)
Alkaline Phosphatase: 91 U/L (ref 38–126)
Anion gap: 11 (ref 5–15)
BUN: 11 mg/dL (ref 6–20)
CO2: 22 mmol/L (ref 22–32)
Calcium: 8.3 mg/dL — ABNORMAL LOW (ref 8.9–10.3)
Chloride: 109 mmol/L (ref 98–111)
Creatinine, Ser: 0.43 mg/dL — ABNORMAL LOW (ref 0.44–1.00)
GFR calc Af Amer: >60 mL/min (ref >60)
GFR calc non Af Amer: >60 mL/min (ref >60)
Glucose, Bld: 103 mg/dL — ABNORMAL HIGH (ref 70–99)
Potassium: 3.7 mmol/L (ref 3.5–5.1)
Sodium: 142 mmol/L (ref 135–145)
Total Bilirubin: 0.2 mg/dL — ABNORMAL LOW (ref 0.3–1.2)
Total Protein: 6.9 g/dL (ref 6.5–8.1)

## 2018-09-18 LAB — PROCALCITONIN: Procalcitonin: <0.1 ng/mL

## 2018-09-18 LAB — ABO/RH: ABO/RH(D): O POS

## 2018-09-18 LAB — HIV ANTIBODY (ROUTINE TESTING W REFLEX): HIV Screen 4th Generation wRfx: NONREACTIVE

## 2018-09-18 LAB — LEGIONELLA PNEUMOPHILA SEROGP 1 UR AG: L. pneumophila Serogp 1 Ur Ag: NEGATIVE

## 2018-09-18 NOTE — Progress Notes (Signed)
PROGRESS NOTE  Kimberly Bentley XLK:[id number] DOB: 30-Jul-1990 DOA: 09/17/2018 PCP: [facility]   LOS: 1 day   Brief Narrative / Interim history: 28 year old female with history of obesity came into the hospital and was admitted on 09/17/2018 as a transfer from [facility].  Patient states that for the past week she has had nonspecific viral type symptoms, and several days ago also developed diarrhea and has been having that for the past 6 days.  She also has had fever and chills.  She was initially hospitalized in 09/16/2018 at [facility] then [facility].  She was placed on steroids along with Remdesivir  Subjective: Feels about the same for the past couple of days, does not feel worse and does not feel better.  Overall weak.  Her breathing seems to be stable.  Assessment & Plan: Principal Problem:   Acute respiratory failure with hypoxia (HCC) Active Problems:   COVID-19   Morbid obesity with BMI of 45.0-49.9, adult (HCC)   Principal Problem Acute Hypoxic Respiratory Failure due to Covid-19 Viral Illness -She remains on 2 L nasal cannula from admission, breathing appears clinically to be comfortable -Continue Decadron, Remdesivir, today is day 2/5 as she received first dose on 7/[facility] at [facility] prior to transfer -encourage prone as able    COVID-19 Labs  Recent Labs    09/17/18 0036  FERRITIN 72  LDH 273*  CRP 13.4*    Lab Results  Component Value Date   SARSCOV2NAA POSITIVE (A) 09/17/2018    Active Problems Morbid obesity -Encouraged weight loss   Scheduled Meds: . dexamethasone  6 mg Intravenous Q24H  . enoxaparin (LOVENOX) injection  40 mg Subcutaneous Q12H  . sodium chloride flush  3 mL Intravenous Q12H   Continuous Infusions: . sodium chloride    . remdesivir 100 mg in NS 250 mL     PRN Meds:.sodium chloride, acetaminophen, chlorpheniramine-HYDROcodone, guaiFENesin-dextromethorphan,  Ipratropium-Albuterol, ondansetron **OR** ondansetron (ZOFRAN) IV, sodium chloride flush  DVT prophylaxis: Lovenox  Code Status: Full code Family Communication: d/w patient  Disposition Plan: home when ready   Consultants:   None   Procedures:   None   Antimicrobials:  None    Objective: Vitals:   09/18/18 0051 09/18/18 0336 09/18/18 0909 09/18/18 0910  BP:    (!) 105/58  Pulse:    84  Resp:    (!) 26  Temp:  98.8 F (37.1 C) 98.1 F (36.7 C)   TempSrc:  Oral Oral   SpO2:  94%  94%  Weight: 132.6 kg     Height: 5\' 4"  (1.626 m)       Intake/Output Summary (Last 24 hours) at 09/18/2018 1158 Last data filed at 09/18/2018 0245 Gross per 24 hour  Intake 483 ml  Output -  Net 483 ml   Filed Weights   09/18/18 0051  Weight: 132.6 kg    Examination:  Constitutional: NAD Eyes: PERRL, lids and conjunctivae normal ENMT: Mucous membranes are moist.  Respiratory: Diminished at the bases, no wheezing, no crackles.  Slight tachypnea noted Cardiovascular: Regular rate and rhythm, no murmurs / rubs / gallops. No LE edema. 2+ pedal pulses.  Abdomen: no tenderness. Bowel sounds positive.  Musculoskeletal: no clubbing / cyanosis.  Skin: no rashes, lesions, ulcers. No induration Neurologic: CN 2-12 grossly intact. Strength 5/5 in all 4.  Psychiatric: Normal judgment and insight. Alert and oriented x 3. Normal mood.    Data Reviewed: I have independently reviewed following  labs and imaging studies  Chest x-ray 7/21-bilateral multifocal pneumonia CBC: Recent Labs  Lab 09/17/18 0036 09/17/18 0559 09/18/18 0230  WBC 6.3 7.1 6.3  NEUTROABS 4.9 6.2 4.4  HGB 11.5* 11.4* 10.7*  HCT 35.4* 35.3* 34.5*  MCV 78.3* 80.0 81.9  PLT 266 264 294   Basic Metabolic Panel: Recent Labs  Lab 09/17/18 0036 09/17/18 0559 09/18/18 0230  NA 137 140 142  K 3.6 3.7 3.7  CL 106 109 109  CO2 22 22 22   GLUCOSE 118* 125* 103*  BUN 7 6 11   CREATININE 0.43* 0.42* 0.43*  CALCIUM 8.5*  8.1* 8.3*   GFR: Estimated Creatinine Clearance: 145.8 mL/min (A) (by C-G formula based on SCr of 0.43 mg/dL (L)). Liver Function Tests: Recent Labs  Lab 09/17/18 0036 09/17/18 0559 09/18/18 0230  AST 48* 51* 48*  ALT 44 44 50*  ALKPHOS 97 95 91  BILITOT 0.4 0.5 0.2*  PROT 7.5 7.5 6.9  ALBUMIN 3.3* 3.1* 2.7*   No results for input(s): LIPASE, AMYLASE in the last 168 hours. No results for input(s): AMMONIA in the last 168 hours. Coagulation Profile: Recent Labs  Lab 09/17/18 0036  INR 1.0   Cardiac Enzymes: No results for input(s): CKTOTAL, CKMB, CKMBINDEX, TROPONINI in the last 168 hours. BNP (last 3 results) No results for input(s): PROBNP in the last 8760 hours. HbA1C: No results for input(s): HGBA1C in the last 72 hours. CBG: No results for input(s): GLUCAP in the last 168 hours. Lipid Profile: Recent Labs    09/17/18 0036  TRIG 137   Thyroid Function Tests: No results for input(s): TSH, T4TOTAL, FREET4, T3FREE, THYROIDAB in the last 72 hours. Anemia Panel: Recent Labs    09/17/18 0036  FERRITIN 72   Urine analysis:    Component Value Date/Time   COLORURINE YELLOW (A) 11/30/2017 1923   APPEARANCEUR HAZY (A) 11/30/2017 1923   APPEARANCEUR Clear 06/25/2014 1527   LABSPEC 1.008 11/30/2017 1923   LABSPEC 1.015 06/25/2014 1527   PHURINE 6.0 11/30/2017 1923   GLUCOSEU NEGATIVE 11/30/2017 1923   GLUCOSEU Negative 06/25/2014 1527   HGBUR NEGATIVE 11/30/2017 1923   BILIRUBINUR NEGATIVE 11/30/2017 1923   BILIRUBINUR Negative 06/25/2014 1527   KETONESUR NEGATIVE 11/30/2017 1923   PROTEINUR 30 (A) 11/30/2017 1923   NITRITE NEGATIVE 11/30/2017 1923   LEUKOCYTESUR NEGATIVE 11/30/2017 1923   LEUKOCYTESUR Negative 06/25/2014 1527   Sepsis Labs: Invalid input(s): PROCALCITONIN, LACTICIDVEN  Recent Results (from the past 240 hour(s))  SARS Coronavirus 2 [facility](Hospital order, Performed in [facility] lab)     Status: Abnormal   Collection Time: 09/17/18  12:36 AM   Specimen: Nasopharyngeal Swab  Result Value Ref Range Status   SARS Coronavirus 2 POSITIVE (A) NEGATIVE Final    Comment: RESULT CALLED TO, READ BACK BY AND VERIFIED WITH: RACHEL HAYDEN RN 0215 09/17/2018 HNM (NOTE) If result is NEGATIVE SARS-CoV-2 target nucleic acids are NOT DETECTED. The SARS-CoV-2 RNA is generally detectable in upper and lower  respiratory specimens during the acute phase of infection. The lowest  concentration of SARS-CoV-2 viral copies this assay can detect is 250  copies / mL. A negative result does not preclude SARS-CoV-2 infection  and should not be used as the sole basis for treatment or other  patient management decisions.  A negative result may occur with  improper specimen collection / handling, submission of specimen other  than nasopharyngeal swab, presence of viral mutation(s) within the  areas targeted by this assay, and inadequate number  of viral copies  (<250 copies / mL). A negative result must be combined with clinical  observations, patient history, and epidemiological information. If result is POSITIVE SARS-CoV-2 target nucleic acids are DETECTED. T he SARS-CoV-2 RNA is generally detectable in upper and lower  respiratory specimens during the acute phase of infection.  Positive  results are indicative of active infection with SARS-CoV-2.  Clinical  correlation with patient history and other diagnostic information is  necessary to determine patient infection status.  Positive results do  not rule out bacterial infection or co-infection with other viruses. If result is PRESUMPTIVE POSTIVE SARS-CoV-2 nucleic acids MAY BE PRESENT.   A presumptive positive result was obtained on the submitted specimen  and confirmed on repeat testing.  While 2019 novel coronavirus  (SARS-CoV-2) nucleic acids may be present in the submitted sample  additional confirmatory testing may be necessary for epidemiological  and / or clinical management purposes   to differentiate between  SARS-CoV-2 and other Sarbecovirus currently known to infect humans.  If clinically indicated additional testing with an alternate test  methodology [phone]) is  advised. The SARS-CoV-2 RNA is generally  detectable in upper and lower respiratory specimens during the acute  phase of infection. The expected result is Negative. Fact Sheet for Patients:  StrictlyIdeas.no Fact Sheet for Healthcare Providers: BankingDealers.co.za This test is not yet approved or cleared by the [place] FDA and has been authorized for detection and/or diagnosis of SARS-CoV-2 by FDA under an Emergency Use Authorization (EUA).  This EUA will remain in effect (meaning this test can be used) for the duration of the COVID-19 declaration under Section 564(b)(1) of the Act, 21 U.S.C. section 360bbb-3(b)(1), unless the authorization is terminated or revoked sooner. Performed at [facility], [address] [place], [place] [address]   Blood Culture (routine x 2)     Status: None (Preliminary result)   Collection Time: 09/17/18  2:09 AM   Specimen: BLOOD  Result Value Ref Range Status   Specimen Description BLOOD RIGHT ASSIST CONTROL  Final   Special Requests   Final    BOTTLES DRAWN AEROBIC AND ANAEROBIC Blood Culture adequate volume   Culture   Final    NO GROWTH 1 DAY Performed at [facility], [address] [place], [place] [address]    Report Status PENDING  Incomplete  Blood Culture (routine x 2)     Status: None (Preliminary result)   Collection Time: 09/17/18  2:10 AM   Specimen: BLOOD  Result Value Ref Range Status   Specimen Description BLOOD RIGHT HAND  Final   Special Requests   Final    BOTTLES DRAWN AEROBIC AND ANAEROBIC Blood Culture adequate volume   Culture   Final    NO GROWTH 1 DAY Performed at [facility], [address], [place], [place] [address]    Report Status PENDING   Incomplete      Radiology Studies: Dg Chest Port 1 View  Result Date: 09/17/2018 CLINICAL DATA:  Chest pain and shortness of breath. EXAM: PORTABLE CHEST 1 VIEW COMPARISON:  None. FINDINGS: Low lung volumes. Normal heart size and mediastinal contours for technique. Moderate bilateral heterogeneous opacities in the mid lower lung zone predominant distribution. No pleural effusion or pneumothorax. No acute osseous abnormalities. IMPRESSION: Lung volumes. Moderate bilateral heterogeneous opacities suspicious for pneumonia, particularly atypical viral organisms. Electronically Signed   By: Keith Rake M.D.   On: 09/17/2018 01:18    Marzetta Board, MD, PhD [facility]  Contact via  [email or web address]  [place] P: [phone] F: [phone]

## 2018-09-19 LAB — CBC
HCT: 35.5 % — ABNORMAL LOW (ref 36.0–46.0)
Hemoglobin: 11 g/dL — ABNORMAL LOW (ref 12.0–15.0)
MCH: 25.2 pg — ABNORMAL LOW (ref 26.0–34.0)
MCHC: 31 g/dL (ref 30.0–36.0)
MCV: 81.2 fL (ref 80.0–100.0)
Platelets: 351 10*3/uL (ref 150–400)
RBC: 4.37 MIL/uL (ref 3.87–5.11)
RDW: 14.4 % (ref 11.5–15.5)
WBC: 6 10*3/uL (ref 4.0–10.5)
nRBC: 0 % (ref 0.0–0.2)

## 2018-09-19 LAB — COMPREHENSIVE METABOLIC PANEL
ALT: 71 U/L — ABNORMAL HIGH (ref 0–44)
AST: 64 U/L — ABNORMAL HIGH (ref 15–41)
Albumin: 2.8 g/dL — ABNORMAL LOW (ref 3.5–5.0)
Alkaline Phosphatase: 99 U/L (ref 38–126)
Anion gap: 9 (ref 5–15)
BUN: 14 mg/dL (ref 6–20)
CO2: 24 mmol/L (ref 22–32)
Calcium: 8.4 mg/dL — ABNORMAL LOW (ref 8.9–10.3)
Chloride: 107 mmol/L (ref 98–111)
Creatinine, Ser: 0.44 mg/dL (ref 0.44–1.00)
GFR calc Af Amer: >60 mL/min (ref >60)
GFR calc non Af Amer: >60 mL/min (ref >60)
Glucose, Bld: 99 mg/dL (ref 70–99)
Potassium: 3.7 mmol/L (ref 3.5–5.1)
Sodium: 140 mmol/L (ref 135–145)
Total Bilirubin: 0.4 mg/dL (ref 0.3–1.2)
Total Protein: 7.1 g/dL (ref 6.5–8.1)

## 2018-09-19 LAB — LACTATE DEHYDROGENASE: LDH: 268 U/L — ABNORMAL HIGH (ref 98–192)

## 2018-09-19 LAB — C-REACTIVE PROTEIN: CRP: 6 mg/dL — ABNORMAL HIGH (ref <1.0)

## 2018-09-19 LAB — D-DIMER, QUANTITATIVE: D-Dimer, Quant: 0.34 ug/mL-FEU (ref 0.00–0.50)

## 2018-09-19 LAB — MAGNESIUM: Magnesium: 2 mg/dL (ref 1.7–2.4)

## 2018-09-19 LAB — FERRITIN: Ferritin: 60 ng/mL (ref 11–307)

## 2018-09-19 NOTE — Progress Notes (Signed)
Pt 95% on room air, 93% on room air when walking.

## 2018-09-19 NOTE — Progress Notes (Signed)
PROGRESS NOTE  Kimberly RacerJaqueline Martinez Bentley YQM:[id number] DOB: 1990-03-19 DOA: 09/17/2018 [facility]: [facility]   LOS: 2 days   Brief Narrative / Interim history: 28 year old female with history of obesity came into the hospital and was admitted on 09/17/2018 as a transfer from [facility].  Patient states that for the past week she has had nonspecific viral type symptoms, and several days ago also developed diarrhea and has been having that for the past 6 days.  She also has had fever and chills.  She was initially hospitalized in 09/16/[facility] at [facility] then [facility] on 7/[facility].  She was placed on steroids along with Remdesivir  Subjective: Feels slightly better today, breathing is better.  Was able to ambulate in the room to the bathroom and back, felt short of breath but better  Assessment & Plan: Principal Problem:   Acute respiratory failure with hypoxia (HCC) Active Problems:   COVID-19   Morbid obesity with BMI of 45.0-49.9, adult (HCC)   Principal Problem Acute Hypoxic Respiratory Failure due to Covid-19 Viral Illness -Breathing seems to be better, still remains on 2 L nasal cannula, sats in the upper 90s and will attempt to wean off to room air if she is able to tolerate -Continue Decadron, Remdesivir, today is day 3/5 as she received [facility] dose [facility] 7/[facility] -Encourage prone as able when laying down   COVID-19 Labs  Recent Labs    09/17/18 0036 09/19/18 0400  DDIMER  --  0.34  FERRITIN 72 60  LDH 273* 268*  CRP 13.4* 6.0*    Lab Results  Component Value Date   SARSCOV2NAA POSITIVE (A) 09/17/2018    Active Problems Morbid obesity -Encouraged weight loss   Scheduled Meds: . dexamethasone  6 mg Intravenous Q24H  . enoxaparin (LOVENOX) injection  40 mg Subcutaneous Q12H  . sodium chloride flush  3 mL Intravenous Q12H   Continuous Infusions: . sodium chloride    . remdesivir 100 mg in NS 250 mL  100 mg (09/18/18 1631)   PRN Meds:.sodium chloride, acetaminophen, chlorpheniramine-HYDROcodone, guaiFENesin-dextromethorphan, Ipratropium-Albuterol, ondansetron **OR** ondansetron (ZOFRAN) IV, sodium chloride flush  DVT prophylaxis: Lovenox  Code Status: Full code Family Communication: d/w patient  Disposition Plan: home when ready   Consultants:   None   Procedures:   None   Antimicrobials:  None    Objective: Vitals:   09/18/18 2025 09/18/18 2335 09/19/18 0415 09/19/18 0815  BP: 128/72 127/72 132/80 117/72  Pulse: 82 83 60 69  Resp: (!) 33 (!) 22 (!) 22 (!) 21  Temp: 98.9 F (37.2 C) 98.7 F (37.1 C) 98.4 F (36.9 C) 98.9 F (37.2 C)  TempSrc: Oral Oral Oral Oral  SpO2: 93% 94% 95% 98%  Weight:      Height:        Intake/Output Summary (Last 24 hours) at 09/19/2018 1334 Last data filed at 09/19/2018 0800 Gross per 24 hour  Intake 495.41 ml  Output -  Net 495.41 ml   Filed Weights   09/18/18 0051  Weight: 132.6 kg    Examination:  Constitutional: No distress, eating breakfast Eyes: No scleral icterus ENMT: Moist mucous membranes Respiratory: Faint bibasilar rhonchi, no wheezing, no crackles Cardiovascular: Regular rate and rhythm, no murmurs.  No edema Abdomen: Soft, nontender, nondistended, positive bowel sounds Musculoskeletal: no clubbing / cyanosis.  Skin: No rashes, lesions Neurologic: No focal deficits, equal strength Psychiatric: Normal judgment and insight. Alert and oriented x 3. Normal  mood.    Data Reviewed: I have independently reviewed following labs and imaging studies    CBC: Recent Labs  Lab 10/08/18 0036 10-08-18 0559 09/18/18 0230 09/19/18 0400  WBC 6.3 7.1 6.3 6.0  NEUTROABS 4.9 6.2 4.4  --   HGB 11.5* 11.4* 10.7* 11.0*  HCT 35.4* 35.3* 34.5* 35.5*  MCV 78.3* 80.0 81.9 81.2  PLT 266 264 294 710   Basic Metabolic Panel: Recent Labs  Lab 10-08-2018 0036 10/08/2018 0559 09/18/18 0230 09/19/18 0400  NA 137 140 142 140   K 3.6 3.7 3.7 3.7  CL 106 109 109 107  CO2 22 22 22 24   GLUCOSE 118* 125* 103* 99  BUN 7 6 11 14   CREATININE 0.43* 0.42* 0.43* 0.44  CALCIUM 8.5* 8.1* 8.3* 8.4*  MG  --   --   --  2.0   GFR: Estimated Creatinine Clearance: 145.8 mL/min (by C-G formula based on SCr of 0.44 mg/dL). Liver Function Tests: Recent Labs  Lab 2018-10-08 0036 2018/10/08 0559 09/18/18 0230 09/19/18 0400  AST 48* 51* 48* 64*  ALT 44 44 50* 71*  ALKPHOS 97 95 91 99  BILITOT 0.4 0.5 0.2* 0.4  PROT 7.5 7.5 6.9 7.1  ALBUMIN 3.3* 3.1* 2.7* 2.8*   No results for input(s): LIPASE, AMYLASE in the last 168 hours. No results for input(s): AMMONIA in the last 168 hours. Coagulation Profile: Recent Labs  Lab 08-Oct-2018 0036  INR 1.0   Cardiac Enzymes: No results for input(s): CKTOTAL, CKMB, CKMBINDEX, TROPONINI in the last 168 hours. BNP (last 3 results) No results for input(s): PROBNP in the last 8760 hours. HbA1C: No results for input(s): HGBA1C in the last 72 hours. CBG: No results for input(s): GLUCAP in the last 168 hours. Lipid Profile: Recent Labs    10/08/18 0036  TRIG 137   Thyroid Function Tests: No results for input(s): TSH, T4TOTAL, FREET4, T3FREE, THYROIDAB in the last 72 hours. Anemia Panel: Recent Labs    2018-10-08 0036 09/19/18 0400  FERRITIN 72 60   Urine analysis:    Component Value Date/Time   COLORURINE YELLOW (A) 11/30/2017 1923   APPEARANCEUR HAZY (A) 11/30/2017 1923   APPEARANCEUR Clear 06/25/2014 1527   LABSPEC 1.008 11/30/2017 1923   LABSPEC 1.015 06/25/2014 1527   PHURINE 6.0 11/30/2017 1923   GLUCOSEU NEGATIVE 11/30/2017 1923   GLUCOSEU Negative 06/25/2014 1527   HGBUR NEGATIVE 11/30/2017 1923   BILIRUBINUR NEGATIVE 11/30/2017 1923   BILIRUBINUR Negative 06/25/2014 1527   KETONESUR NEGATIVE 11/30/2017 1923   PROTEINUR 30 (A) 11/30/2017 1923   NITRITE NEGATIVE 11/30/2017 1923   LEUKOCYTESUR NEGATIVE 11/30/2017 1923   LEUKOCYTESUR Negative 06/25/2014 1527    Sepsis Labs: Invalid input(s): PROCALCITONIN, LACTICIDVEN  Recent Results (from the past 240 hour(s))  SARS Coronavirus 2 [facility] order, Performed in [place] hospital lab)     Status: Abnormal   Collection Time: 2018/10/08 12:36 AM   Specimen: Nasopharyngeal Swab  Result Value Ref Range Status   SARS Coronavirus 2 POSITIVE (A) NEGATIVE Final    Comment: RESULT CALLED TO, READ BACK BY AND VERIFIED WITH: RACHEL HAYDEN RN 0215 10-08-2018 HNM (NOTE) If result is NEGATIVE SARS-CoV-2 target nucleic acids are NOT DETECTED. The SARS-CoV-2 RNA is generally detectable in upper and lower  respiratory specimens during the acute phase of infection. The lowest  concentration of SARS-CoV-2 viral copies this assay can detect is 250  copies / mL. A negative result does not preclude SARS-CoV-2 infection  and should not be  used as the sole basis for treatment or other  patient management decisions.  A negative result may occur with  improper specimen collection / handling, submission of specimen other  than nasopharyngeal swab, presence of viral mutation(s) within the  areas targeted by this assay, and inadequate number of viral copies  (<250 copies / mL). A negative result must be combined with clinical  observations, patient history, and epidemiological information. If result is POSITIVE SARS-CoV-2 target nucleic acids are DETECTED. T he SARS-CoV-2 RNA is generally detectable in upper and lower  respiratory specimens during the acute phase of infection.  Positive  results are indicative of active infection with SARS-CoV-2.  Clinical  correlation with patient history and other diagnostic information is  necessary to determine patient infection status.  Positive results do  not rule out bacterial infection or co-infection with other viruses. If result is PRESUMPTIVE POSTIVE SARS-CoV-2 nucleic acids MAY BE PRESENT.   A presumptive positive result was obtained on the submitted specimen  and  confirmed on repeat testing.  While 2019 novel coronavirus  (SARS-CoV-2) nucleic acids may be present in the submitted sample  additional confirmatory testing may be necessary for epidemiological  and / or clinical management purposes  to differentiate between  SARS-CoV-2 and other Sarbecovirus currently known to infect humans.  If clinically indicated additional testing with an alternate test  methodology [phone] is  advised. The SARS-CoV-2 RNA is generally  detectable in upper and lower respiratory specimens during the acute  phase of infection. The expected result is Negative. Fact Sheet for Patients:  BoilerBrush.com.cyhttps://www.fda.gov/media/136312/download Fact Sheet for Healthcare Providers: https://pope.com/https://www.fda.gov/media/136313/download This test is not yet approved or cleared by the [place] FDA and has been authorized for detection and/or diagnosis of SARS-CoV-2 by FDA under an Emergency Use Authorization (EUA).  This EUA will remain in effect (meaning this test can be used) for the duration of the COVID-19 declaration under Section 564(b)(1) of the Act, 21 U.S.C. section 360bbb-3(b)(1), unless the authorization is terminated or revoked sooner. Performed at [facility], [address] [place], [place] [address]   Blood Culture (routine x 2)     Status: None (Preliminary result)   Collection Time: 09/17/18  2:09 AM   Specimen: BLOOD  Result Value Ref Range Status   Specimen Description BLOOD RIGHT ASSIST CONTROL  Final   Special Requests   Final    BOTTLES DRAWN AEROBIC AND ANAEROBIC Blood Culture adequate volume   Culture   Final    NO GROWTH 1 DAY Performed at [facility], [address] [place], [place] [address]    Report Status PENDING  Incomplete  Blood Culture (routine x 2)     Status: None (Preliminary result)   Collection Time: 09/17/18  2:10 AM   Specimen: BLOOD  Result Value Ref Range Status   Specimen Description BLOOD RIGHT HAND  Final    Special Requests   Final    BOTTLES DRAWN AEROBIC AND ANAEROBIC Blood Culture adequate volume   Culture   Final    NO GROWTH 1 DAY Performed at [facility], [address] [place], [place] [address]    Report Status PENDING  Incomplete      Radiology Studies: No results found.  Pamella Pertostin Gherghe, MD, PhD [facility]  Contact via  [email or web address]  [facility] Office Info P: [phone] F: [phone]

## 2018-09-19 NOTE — Progress Notes (Signed)
   09/19/18 1200  Clinical Encounter Type  Visited With Patient  Visit Type Initial;Psychological support;Spiritual support  Referral From Chaplain  Consult/Referral To Chaplain  Spiritual Encounters  Spiritual Needs Emotional;Other (Comment) (Spiritual Care Conversation/Support)  Stress Factors  Patient Stress Factors Health changes   I spoke briefly with Kimberly Bentley per referral from another Chaplain and friend of hers.  Kimberly Bentley did not identify any needs today, but was very grateful that I called and comforted to know that people are thinking about her and praying for her.   Please, contact Spiritual Care for further assistance.   Chaplain Shanon Ace M.Div., [facility]

## 2018-09-20 LAB — CBC
HCT: 34.6 % — ABNORMAL LOW (ref 36.0–46.0)
Hemoglobin: 11 g/dL — ABNORMAL LOW (ref 12.0–15.0)
MCH: 25.7 pg — ABNORMAL LOW (ref 26.0–34.0)
MCHC: 31.8 g/dL (ref 30.0–36.0)
MCV: 80.8 fL (ref 80.0–100.0)
Platelets: 387 10*3/uL (ref 150–400)
RBC: 4.28 MIL/uL (ref 3.87–5.11)
RDW: 14.3 % (ref 11.5–15.5)
WBC: 8.8 10*3/uL (ref 4.0–10.5)
nRBC: 0 % (ref 0.0–0.2)

## 2018-09-20 LAB — COMPREHENSIVE METABOLIC PANEL
ALT: 135 U/L — ABNORMAL HIGH (ref 0–44)
AST: 97 U/L — ABNORMAL HIGH (ref 15–41)
Albumin: 2.7 g/dL — ABNORMAL LOW (ref 3.5–5.0)
Alkaline Phosphatase: 104 U/L (ref 38–126)
Anion gap: 9 (ref 5–15)
BUN: 17 mg/dL (ref 6–20)
CO2: 23 mmol/L (ref 22–32)
Calcium: 8.3 mg/dL — ABNORMAL LOW (ref 8.9–10.3)
Chloride: 105 mmol/L (ref 98–111)
Creatinine, Ser: 0.42 mg/dL — ABNORMAL LOW (ref 0.44–1.00)
GFR calc Af Amer: >60 mL/min (ref >60)
GFR calc non Af Amer: >60 mL/min (ref >60)
Glucose, Bld: 88 mg/dL (ref 70–99)
Potassium: 3.5 mmol/L (ref 3.5–5.1)
Sodium: 137 mmol/L (ref 135–145)
Total Bilirubin: 0.4 mg/dL (ref 0.3–1.2)
Total Protein: 6.9 g/dL (ref 6.5–8.1)

## 2018-09-20 LAB — MAGNESIUM: Magnesium: 2 mg/dL (ref 1.7–2.4)

## 2018-09-20 LAB — LACTATE DEHYDROGENASE: LDH: 208 U/L — ABNORMAL HIGH (ref 98–192)

## 2018-09-20 LAB — C-REACTIVE PROTEIN: CRP: 2.4 mg/dL — ABNORMAL HIGH (ref <1.0)

## 2018-09-20 LAB — FERRITIN: Ferritin: 53 ng/mL (ref 11–307)

## 2018-09-20 LAB — D-DIMER, QUANTITATIVE: D-Dimer, Quant: <0.27 ug/mL-FEU (ref 0.00–0.50)

## 2018-09-20 NOTE — Progress Notes (Addendum)
PROGRESS NOTE  Kimberly Bentley EQA:[id number] DOB: Nov 02, 1990 DOA: 09/17/2018 PCP: [facility] [place]   LOS: 3 days   Brief Narrative / Interim history: 28 year old female with history of obesity came into the hospital and was admitted on 09/17/2018 as a transfer from [facility].  Patient states that for the past week she has had nonspecific viral type symptoms, and several days ago also developed diarrhea and has been having that for the past 6 days.  She also has had fever and chills.  She was initially hospitalized in 09/16/2018 at [facility] then transferred to [facility] on 7/21.  She was placed on steroids along with Remdesivir  Subjective: Much improved today, denies any shortness of breath, no chest pain, no abdominal pain, nausea or vomiting.  Has been walking in the room on room air without significant difficulties  Assessment & Plan: Principal Problem:   Acute respiratory failure with hypoxia ([place]) Active Problems:   COVID-19   Morbid obesity with BMI of 45.0-49.9, adult (HCC)   Principal Problem Acute Hypoxic Respiratory Failure due to Covid-19 Viral Illness -Breathing better, currently on room air -Continue Decadron, Remdesivir, today is day 4/5 as she received first dose on 7/21 at [facility] prior to transfer -Anticipate discharge home in 24 hours once she finishes last dose of Remdesivir   COVID-19 Labs  Recent Labs    09/19/18 0400 09/20/18 0305  DDIMER 0.34 <0.27  FERRITIN 60 53  LDH 268* 208*  CRP 6.0* 2.4*    Lab Results  Component Value Date   SARSCOV2NAA POSITIVE (A) 09/17/2018    Active Problems Elevated LFTs -Likely related to Remdesivir/infection, monitor.  Asymptomatic.  Suspect a degree of underlying fatty liver  Morbid obesity -Encouraged weight loss   Scheduled Meds: . dexamethasone  6 mg Intravenous Q24H  . enoxaparin (LOVENOX) injection  40 mg Subcutaneous Q12H  . sodium chloride flush  3 mL  Intravenous Q12H   Continuous Infusions: . sodium chloride    . remdesivir 100 mg in NS 250 mL Stopped (09/19/18 1626)   PRN Meds:.sodium chloride, acetaminophen, chlorpheniramine-HYDROcodone, guaiFENesin-dextromethorphan, Ipratropium-Albuterol, ondansetron **OR** ondansetron (ZOFRAN) IV, sodium chloride flush  DVT prophylaxis: Lovenox  Code Status: Full code Family Communication: d/w patient  Disposition Plan: home when ready   Consultants:   None   Procedures:   None   Antimicrobials:  None    Objective: Vitals:   09/19/18 1946 09/19/18 2340 09/20/18 0335 09/20/18 0832  BP: 116/64 122/73 118/71 102/62  Pulse: 81 62 63 74  Resp: (!) 33 (!) 24 (!) 24 (!) 21  Temp: 98.8 F (37.1 C) 98.3 F (36.8 C) 98.1 F (36.7 C) 98.3 F (36.8 C)  TempSrc: Oral Oral Oral Oral  SpO2: 93% 92% 93% 98%  Weight:      Height:        Intake/Output Summary (Last 24 hours) at 09/20/2018 1014 Last data filed at 09/20/2018 0900 Gross per 24 hour  Intake 990.43 ml  Output -  Net 990.43 ml   Filed Weights   09/18/18 0051  Weight: 132.6 kg    Examination:  Constitutional: NAD, calm, comfortable Eyes: PERRL, lids and conjunctivae normal ENMT: Mucous membranes are moist.  Neck: normal, supple Respiratory: clear to auscultation bilaterally, no wheezing, no crackles. Normal respiratory effort.  Cardiovascular: Regular rate and rhythm, no murmurs / rubs / gallops. No LE edema. 2+ pedal pulses.  Abdomen: no tenderness. Bowel sounds positive.  Skin: no rashes, lesions, ulcers. No induration Neurologic: non  focal  Data Reviewed: I have independently reviewed following labs and imaging studies    CBC: Recent Labs  Lab 09/17/18 0036 09/17/18 0559 09/18/18 0230 09/19/18 0400 09/20/18 0305  WBC 6.3 7.1 6.3 6.0 8.8  NEUTROABS 4.9 6.2 4.4  --   --   HGB 11.5* 11.4* 10.7* 11.0* 11.0*  HCT 35.4* 35.3* 34.5* 35.5* 34.6*  MCV 78.3* 80.0 81.9 81.2 80.8  PLT 266 264 294 351 387    Basic Metabolic Panel: Recent Labs  Lab 09/17/18 0036 09/17/18 0559 09/18/18 0230 09/19/18 0400 09/20/18 0305  NA 137 140 142 140 137  K 3.6 3.7 3.7 3.7 3.5  CL 106 109 109 107 105  CO2 22 22 22 24 23   GLUCOSE 118* 125* 103* 99 88  BUN 7 6 11 14 17   CREATININE 0.43* 0.42* 0.43* 0.44 0.42*  CALCIUM 8.5* 8.1* 8.3* 8.4* 8.3*  MG  --   --   --  2.0 2.0   GFR: Estimated Creatinine Clearance: 145.8 mL/min (A) (by C-G formula based on SCr of 0.42 mg/dL (L)). Liver Function Tests: Recent Labs  Lab 09/17/18 0036 09/17/18 0559 09/18/18 0230 09/19/18 0400 09/20/18 0305  AST 48* 51* 48* 64* 97*  ALT 44 44 50* 71* 135*  ALKPHOS 97 95 91 99 104  BILITOT 0.4 0.5 0.2* 0.4 0.4  PROT 7.5 7.5 6.9 7.1 6.9  ALBUMIN 3.3* 3.1* 2.7* 2.8* 2.7*   No results for input(s): LIPASE, AMYLASE in the last 168 hours. No results for input(s): AMMONIA in the last 168 hours. Coagulation Profile: Recent Labs  Lab 09/17/18 0036  INR 1.0   Cardiac Enzymes: No results for input(s): CKTOTAL, CKMB, CKMBINDEX, TROPONINI in the last 168 hours. BNP (last 3 results) No results for input(s): PROBNP in the last 8760 hours. HbA1C: No results for input(s): HGBA1C in the last 72 hours. CBG: No results for input(s): GLUCAP in the last 168 hours. Lipid Profile: No results for input(s): CHOL, HDL, LDLCALC, TRIG, CHOLHDL, LDLDIRECT in the last 72 hours. Thyroid Function Tests: No results for input(s): TSH, T4TOTAL, FREET4, T3FREE, THYROIDAB in the last 72 hours. Anemia Panel: Recent Labs    09/19/18 0400 09/20/18 0305  FERRITIN 60 53   Urine analysis:    Component Value Date/Time   COLORURINE YELLOW (A) 11/30/2017 1923   APPEARANCEUR HAZY (A) 11/30/2017 1923   APPEARANCEUR Clear 06/25/2014 1527   LABSPEC 1.008 11/30/2017 1923   LABSPEC 1.015 06/25/2014 1527   PHURINE 6.0 11/30/2017 1923   GLUCOSEU NEGATIVE 11/30/2017 1923   GLUCOSEU Negative 06/25/2014 1527   HGBUR NEGATIVE 11/30/2017 1923    BILIRUBINUR NEGATIVE 11/30/2017 1923   BILIRUBINUR Negative 06/25/2014 1527   KETONESUR NEGATIVE 11/30/2017 1923   PROTEINUR 30 (A) 11/30/2017 1923   NITRITE NEGATIVE 11/30/2017 1923   LEUKOCYTESUR NEGATIVE 11/30/2017 1923   LEUKOCYTESUR Negative 06/25/2014 1527   Sepsis Labs: Invalid input(s): PROCALCITONIN, LACTICIDVEN  Recent Results (from the past 240 hour(s))  SARS Coronavirus 2 [facility](Hospital order, Performed in [facility] hospital lab)     Status: Abnormal   Collection Time: 09/17/18 12:36 AM   Specimen: Nasopharyngeal Swab  Result Value Ref Range Status   SARS Coronavirus 2 POSITIVE (A) NEGATIVE Final    Comment: RESULT CALLED TO, READ BACK BY AND VERIFIED WITH: RACHEL HAYDEN RN 0215 09/17/2018 HNM (NOTE) If result is NEGATIVE SARS-CoV-2 target nucleic acids are NOT DETECTED. The SARS-CoV-2 RNA is generally detectable in upper and lower  respiratory specimens during the acute phase of  infection. The lowest  concentration of SARS-CoV-2 viral copies this assay can detect is 250  copies / mL. A negative result does not preclude SARS-CoV-2 infection  and should not be used as the sole basis for treatment or other  patient management decisions.  A negative result may occur with  improper specimen collection / handling, submission of specimen other  than nasopharyngeal swab, presence of viral mutation(s) within the  areas targeted by this assay, and inadequate number of viral copies  (<250 copies / mL). A negative result must be combined with clinical  observations, patient history, and epidemiological information. If result is POSITIVE SARS-CoV-2 target nucleic acids are DETECTED. T he SARS-CoV-2 RNA is generally detectable in upper and lower  respiratory specimens during the acute phase of infection.  Positive  results are indicative of active infection with SARS-CoV-2.  Clinical  correlation with patient history and other diagnostic information is  necessary to determine  patient infection status.  Positive results do  not rule out bacterial infection or co-infection with other viruses. If result is PRESUMPTIVE POSTIVE SARS-CoV-2 nucleic acids MAY BE PRESENT.   A presumptive positive result was obtained on the submitted specimen  and confirmed on repeat testing.  While 2019 novel coronavirus  (SARS-CoV-2) nucleic acids may be present in the submitted sample  additional confirmatory testing may be necessary for epidemiological  and / or clinical management purposes  to differentiate between  SARS-CoV-2 and other Sarbecovirus currently known to infect humans.  If clinically indicated additional testing with an alternate test  methodology [phone] is  advised. The SARS-CoV-2 RNA is generally  detectable in upper and lower respiratory specimens during the acute  phase of infection. The expected result is Negative. Fact Sheet for Patients:  BoilerBrush.com.cyhttps://www.fda.gov/media/136312/download Fact Sheet for Healthcare Providers: https://pope.com/https://www.fda.gov/media/136313/download This test is not yet approved or cleared by the [place] FDA and has been authorized for detection and/or diagnosis of SARS-CoV-2 by FDA under an Emergency Use Authorization (EUA).  This EUA will remain in effect (meaning this test can be used) for the duration of the COVID-19 declaration under Section 564(b)(1) of the Act, 21 U.S.C. section 360bbb-3(b)(1), unless the authorization is terminated or revoked sooner. Performed at [facility], [address] [place], [place] [address]   Blood Culture (routine x 2)     Status: None (Preliminary result)   Collection Time: 09/17/18  2:09 AM   Specimen: BLOOD  Result Value Ref Range Status   Specimen Description BLOOD RIGHT ASSIST CONTROL  Final   Special Requests   Final    BOTTLES DRAWN AEROBIC AND ANAEROBIC Blood Culture adequate volume   Culture   Final    NO GROWTH 3 DAYS Performed at [facility], [address]  [place], [place] [address]    Report Status PENDING  Incomplete  Blood Culture (routine x 2)     Status: None (Preliminary result)   Collection Time: 09/17/18  2:10 AM   Specimen: BLOOD  Result Value Ref Range Status   Specimen Description BLOOD RIGHT HAND  Final   Special Requests   Final    BOTTLES DRAWN AEROBIC AND ANAEROBIC Blood Culture adequate volume   Culture   Final    NO GROWTH 3 DAYS Performed at [facility], [address] [place], [place] [address]    Report Status PENDING  Incomplete      Radiology Studies: No results found.  Pamella Pertostin Gherghe, MD, PhD [facility]  Contact via  [email or web address]  [place] Office Info P: [phone] F: [phone]

## 2018-09-21 LAB — COMPREHENSIVE METABOLIC PANEL
ALT: 130 U/L — ABNORMAL HIGH (ref 0–44)
AST: 67 U/L — ABNORMAL HIGH (ref 15–41)
Albumin: 2.9 g/dL — ABNORMAL LOW (ref 3.5–5.0)
Alkaline Phosphatase: 114 U/L (ref 38–126)
Anion gap: 9 (ref 5–15)
BUN: 16 mg/dL (ref 6–20)
CO2: 24 mmol/L (ref 22–32)
Calcium: 8.5 mg/dL — ABNORMAL LOW (ref 8.9–10.3)
Chloride: 104 mmol/L (ref 98–111)
Creatinine, Ser: 0.41 mg/dL — ABNORMAL LOW (ref 0.44–1.00)
GFR calc Af Amer: >60 mL/min (ref >60)
GFR calc non Af Amer: >60 mL/min (ref >60)
Glucose, Bld: 97 mg/dL (ref 70–99)
Potassium: 3.7 mmol/L (ref 3.5–5.1)
Sodium: 137 mmol/L (ref 135–145)
Total Bilirubin: 0.3 mg/dL (ref 0.3–1.2)
Total Protein: 7.2 g/dL (ref 6.5–8.1)

## 2018-09-21 LAB — C-REACTIVE PROTEIN: CRP: 1.6 mg/dL — ABNORMAL HIGH (ref <1.0)

## 2018-09-21 MED ORDER — GUAIFENESIN-DM 100-10 MG/5ML PO SYRP: 10.0000 mL | ORAL_SOLUTION | ORAL | 0 refills | Status: DC | PRN

## 2018-09-21 MED ORDER — DEXAMETHASONE 6 MG PO TABS: 6.0000 mg | ORAL_TABLET | Freq: Every day | ORAL | 0 refills | Status: DC

## 2018-09-21 NOTE — Discharge Instructions (Signed)
Follow with [facility] in 5-7 days  Please get a complete blood count and chemistry panel checked by your Primary MD at your next visit, and again as instructed by your Primary MD. Please get your medications reviewed and adjusted by your Primary MD.  Please request your Primary MD to go over all Hospital Tests and Procedure/Radiological results at the follow up, please get all Hospital records sent to your Prim MD by signing hospital release before you go home.  In some cases, there will be blood work, cultures and biopsy results pending at the time of your discharge. Please request that your primary care M.D. goes through all the records of your hospital data and follows up on these results.  If you had Pneumonia of Lung problems at the Hospital: Please get a 2 view Chest X ray done in 6-8 weeks after hospital discharge or sooner if instructed by your Primary MD.  If you have Congestive Heart Failure: Please call your Cardiologist or Primary MD anytime you have any of the following symptoms:  1) 3 pound weight gain in 24 hours or 5 pounds in 1 week  2) shortness of breath, with or without a dry hacking cough  3) swelling in the hands, feet or stomach  4) if you have to sleep on extra pillows at night in order to breathe  Follow cardiac low salt diet and 1.5 lit/day fluid restriction.  If you have diabetes Accuchecks 4 times/day, Once in AM empty stomach and then before each meal. Log in all results and show them to your primary doctor at your next visit. If any glucose reading is under 80 or above 300 call your primary MD immediately.  If you have Seizure/Convulsions/Epilepsy: Please do not drive, operate heavy machinery, participate in activities at heights or participate in high speed sports until you have seen by Primary MD or a Neurologist and advised to do so again. Per [facility] [place] DM[place] statutes, patients with seizures are not allowed to drive until they have  been seizure-free for six months.  Use caution when using heavy equipment or power tools. Avoid working on ladders or at heights. Take showers instead of baths. Ensure the water temperature is not too high on the home water heater. Do not go swimming alone. Do not lock yourself in a room alone (i.e. bathroom). When caring for infants or small children, sit down when holding, feeding, or changing them to minimize risk of injury to the child in the event you have a seizure. Maintain good sleep hygiene. Avoid alcohol.   If you had Gastrointestinal Bleeding: Please ask your Primary MD to check a complete blood count within one week of discharge or at your next visit. Your endoscopic/colonoscopic biopsies that are pending at the time of discharge, will also need to followed by your Primary MD.  Get Medicines reviewed and adjusted. Please take all your medications with you for your next visit with your Primary MD  Please request your Primary MD to go over all hospital tests and procedure/radiological results at the follow up, please ask your Primary MD to get all Hospital records sent to his/her office.  If you experience worsening of your admission symptoms, develop shortness of breath, life threatening emergency, suicidal or homicidal thoughts you must seek medical attention immediately by calling 911 or calling your MD immediately  if symptoms less severe.  You must read complete instructions/literature along with all the possible adverse reactions/side effects for all the Medicines you  take and that have been prescribed to you. Take any new Medicines after you have completely understood and accpet all the possible adverse reactions/side effects.   Do not drive or operate heavy machinery when taking Pain medications.   Do not take more than prescribed Pain, Sleep and Anxiety Medications  Special Instructions: If you have smoked or chewed Tobacco  in the last 2 yrs please stop smoking, stop any  regular Alcohol  and or any Recreational drug use.  Wear Seat belts while driving.  Please note You were cared for by a hospitalist during your hospital stay. If you have any questions about your discharge medications or the care you received while you were in the hospital after you are discharged, you can call the unit and asked to speak with the hospitalist on call if the hospitalist that took care of you is not available. Once you are discharged, your primary care physician will handle any further medical issues. Please note that NO REFILLS for any discharge medications will be authorized once you are discharged, as it is imperative that you return to your primary care physician (or establish a relationship with a primary care physician if you do not have one) for your aftercare needs so that they can reassess your need for medications and monitor your lab values.  You can reach the hospitalist office at phone [phone] or fax [phone]   If you do not have a primary care physician, you can call [phone] for a physician referral.  Activity: As tolerated with Full fall precautions use walker/cane & assistance as needed    Diet: regular  Disposition Home

## 2018-09-21 NOTE — Discharge Summary (Signed)
Physician Discharge Summary  Kimberly Bentley TIW:[id number] DOB: 11-28-1990 DOA: 09/17/2018  PCP: [facility] [place] date: 09/17/2018 Discharge date: 09/21/2018  Admitted From: home Disposition:  home  Recommendations for Outpatient Follow-up:  1. Follow up with PCP in 1-2 weeks  Home Health: none  Equipment/Devices: none   Discharge Condition: stable CODE STATUS: Full code Diet recommendation: regular   HPI: Per admitting MD, 28 year old female with no significant medical history presents to the hospital as a transfer from [facility].  Patient states that 1 week ago she developed viral type symptoms.  3 days later she developed diarrhea.  She has had diarrhea for about 6 days.  She had developed fever and chills along with diarrhea.  She works at [employer].  Patient lives alone.  She has had no sick contacts that she knows of.  She was admitted to [facility].  She was seen by critical care.  She was started on remdesivir and Decadron.  Patient had been started on IV heparin due to an elevated d-dimer.  Critical care felt the patient would benefit from intermediate dosing Lovenox.  She was subsequently transferred to [place] due to her COVID positive status.  Patient states she still is short of breath and fatigues quite easily.  Diarrhea has since resolved.  Hospital Course: Principal Problem  Acute Hypoxic Respiratory Failure due to Covid-19 Viral Illness -patient was admitted to the hospital with hypoxia as well as multifocal pneumonia in the setting of COVID-19 viral illness.  She was placed on steroids, Remdesivir with improvement in her respiratory status.  She was able to be weaned off to room air, feeling back to baseline, able to ambulate without difficulties or further shortness of breath.  She finished Remdesivir treatment while hospitalized, and she was prescribed Decadron for 5 additional days to  complete a 10-day course.  Her inflammatory markers continue to improve COVID-19 Labs  Recent Labs    09/19/18 0400 09/20/18 0305 09/21/18 0115  DDIMER 0.34 <0.27  --   FERRITIN 60 53  --   LDH 268* 208*  --   CRP 6.0* 2.4* 1.6*    Lab Results  Component Value Date   SARSCOV2NAA POSITIVE (A) 09/17/2018     Active Problems Elevated LFTs -Likely related to Remdesivir/infection, monitor.  Stable/improving Morbid obesity -Encouraged weight loss   Discharge Diagnoses:  Principal Problem:   Acute respiratory failure with hypoxia ([place]) Active Problems:   COVID-19   Morbid obesity with BMI of 45.0-49.9, adult [facility])     Discharge Instructions   Allergies as of 09/21/2018      Reactions   Cheese Itching      Medication List    TAKE these medications   dexamethasone 6 MG tablet Commonly known as: DECADRON Take 1 tablet (6 mg total) by mouth daily.   guaiFENesin-dextromethorphan 100-10 MG/5ML syrup Commonly known as: ROBITUSSIN DM Take 10 mLs by mouth every 4 (four) hours as needed for cough.      Follow-up [place]. Schedule an appointment as soon as possible for a visit in 3 week(s).   Specialty: General Practice Contact information: [place] [address] [phone]           Consultations:  None   Procedures/Studies:  Dg Chest Port 1 View  Result Date: 09/17/2018 CLINICAL DATA:  Chest pain and shortness of breath. EXAM: PORTABLE CHEST 1 VIEW COMPARISON:  None. FINDINGS: Low lung volumes. Normal heart size and mediastinal contours for technique. Moderate bilateral heterogeneous opacities in the mid lower lung zone predominant distribution. No pleural effusion or pneumothorax. No acute osseous abnormalities. IMPRESSION: Lung volumes. Moderate bilateral heterogeneous opacities suspicious for pneumonia, particularly atypical viral organisms. Electronically Signed   By: Narda RutherfordMelanie  Sanford M.D.    On: 09/17/2018 01:18      Subjective: - no chest pain, shortness of breath, no abdominal pain, nausea or vomiting.    Discharge Exam: BP 121/71 (BP Location: Left Arm)    Pulse 83    Temp 97.7 F (36.5 C) (Axillary)    Resp (!) 21    Ht 5\' 4"  (1.626 m)    Wt 132.6 kg    SpO2 94%    BMI 50.18 kg/m   General: Pt is alert, awake, not in acute distress Cardiovascular: RRR, S1/S2 +, no rubs, no gallops Respiratory: CTA bilaterally, no wheezing, no rhonchi Abdominal: Soft, NT, ND, bowel sounds + Extremities: no edema, no cyanosis    The results of significant diagnostics from this hospitalization (including imaging, microbiology, ancillary and laboratory) are listed below for reference.     Microbiology: Recent Results (from the past 240 hour(s))  SARS Coronavirus 2 [facility](Hospital order, Performed in [facility] lab)     Status: Abnormal   Collection Time: 09/17/18 12:36 AM   Specimen: Nasopharyngeal Swab  Result Value Ref Range Status   SARS Coronavirus 2 POSITIVE (A) NEGATIVE Final    Comment: RESULT CALLED TO, READ BACK BY AND VERIFIED WITH: RACHEL HAYDEN RN 0215 09/17/2018 HNM (NOTE) If result is NEGATIVE SARS-CoV-2 target nucleic acids are NOT DETECTED. The SARS-CoV-2 RNA is generally detectable in upper and lower  respiratory specimens during the acute phase of infection. The lowest  concentration of SARS-CoV-2 viral copies this assay can detect is 250  copies / mL. A negative result does not preclude SARS-CoV-2 infection  and should not be used as the sole basis for treatment or other  patient management decisions.  A negative result may occur with  improper specimen collection / handling, submission of specimen other  than nasopharyngeal swab, presence of viral mutation(s) within the  areas targeted by this assay, and inadequate number of viral copies  (<250 copies / mL). A negative result must be combined with clinical  observations, patient history, and  epidemiological information. If result is POSITIVE SARS-CoV-2 target nucleic acids are DETECTED. T he SARS-CoV-2 RNA is generally detectable in upper and lower  respiratory specimens during the acute phase of infection.  Positive  results are indicative of active infection with SARS-CoV-2.  Clinical  correlation with patient history and other diagnostic information is  necessary to determine patient infection status.  Positive results do  not rule out bacterial infection or co-infection with other viruses. If result is PRESUMPTIVE POSTIVE SARS-CoV-2 nucleic acids MAY BE PRESENT.   A presumptive positive result was obtained on the submitted specimen  and confirmed on repeat testing.  While 2019 novel coronavirus  (SARS-CoV-2) nucleic acids may be present in the submitted sample  additional confirmatory testing may be necessary for epidemiological  and / or clinical management purposes  to differentiate between  SARS-CoV-2 and other Sarbecovirus currently known to infect humans.  If clinically indicated additional testing with an alternate test  methodology [phone]([id number]) is  advised. The SARS-CoV-2 RNA is generally  detectable in upper and lower respiratory specimens during the acute  phase of infection. The expected result is Negative.  Fact Sheet for Patients:  BoilerBrush.com.cyhttps://www.fda.gov/media/136312/download Fact Sheet for Healthcare Providers: https://pope.com/https://www.fda.gov/media/136313/download This test is not yet approved or cleared by the [place] FDA and has been authorized for detection and/or diagnosis of SARS-CoV-2 by FDA under an Emergency Use Authorization (EUA).  This EUA will remain in effect (meaning this test can be used) for the duration of the COVID-19 declaration under Section 564(b)(1) of the Act, 21 U.S.C. section 360bbb-3(b)(1), unless the authorization is terminated or revoked sooner. Performed at [facility], [address] [place], [place] [address]   Blood  Culture (routine x 2)     Status: None (Preliminary result)   Collection Time: 09/17/18  2:09 AM   Specimen: BLOOD  Result Value Ref Range Status   Specimen Description BLOOD RIGHT ASSIST CONTROL  Final   Special Requests   Final    BOTTLES DRAWN AEROBIC AND ANAEROBIC Blood Culture adequate volume   Culture   Final    NO GROWTH 4 DAYS Performed at [facility], [address] [place], [place] [address]    Report Status PENDING  Incomplete  Blood Culture (routine x 2)     Status: None (Preliminary result)   Collection Time: 09/17/18  2:10 AM   Specimen: BLOOD  Result Value Ref Range Status   Specimen Description BLOOD RIGHT HAND  Final   Special Requests   Final    BOTTLES DRAWN AEROBIC AND ANAEROBIC Blood Culture adequate volume   Culture   Final    NO GROWTH 4 DAYS Performed at [facility], [address] [place], [place] [address]    Report Status PENDING  Incomplete     Labs: BNP (last 3 results) Recent Labs    09/17/18 0036  BNP 9.0   Basic Metabolic Panel: Recent Labs  Lab 09/17/18 0559 09/18/18 0230 09/19/18 0400 09/20/18 0305 09/21/18 0115  NA 140 142 140 137 137  K 3.7 3.7 3.7 3.5 3.7  CL 109 109 107 105 104  CO2 22 22 24 23 24   GLUCOSE 125* 103* 99 88 97  BUN 6 11 14 17 16   CREATININE 0.42* 0.43* 0.44 0.42* 0.41*  CALCIUM 8.1* 8.3* 8.4* 8.3* 8.5*  MG  --   --  2.0 2.0  --    Liver Function Tests: Recent Labs  Lab 09/17/18 0559 09/18/18 0230 09/19/18 0400 09/20/18 0305 09/21/18 0115  AST 51* 48* 64* 97* 67*  ALT 44 50* 71* 135* 130*  ALKPHOS 95 91 99 104 114  BILITOT 0.5 0.2* 0.4 0.4 0.3  PROT 7.5 6.9 7.1 6.9 7.2  ALBUMIN 3.1* 2.7* 2.8* 2.7* 2.9*   No results for input(s): LIPASE, AMYLASE in the last 168 hours. No results for input(s): AMMONIA in the last 168 hours. CBC: Recent Labs  Lab 09/17/18 0036 09/17/18 0559 09/18/18 0230 09/19/18 0400 09/20/18 0305  WBC 6.3 7.1 6.3 6.0 8.8  NEUTROABS 4.9 6.2 4.4  --    --   HGB 11.5* 11.4* 10.7* 11.0* 11.0*  HCT 35.4* 35.3* 34.5* 35.5* 34.6*  MCV 78.3* 80.0 81.9 81.2 80.8  PLT 266 264 294 351 387   Cardiac Enzymes: No results for input(s): CKTOTAL, CKMB, CKMBINDEX, TROPONINI in the last 168 hours. BNP: Invalid input(s): POCBNP CBG: No results for input(s): GLUCAP in the last 168 hours. D-Dimer Recent Labs    09/19/18 0400 09/20/18 0305  DDIMER 0.34 <0.27   Hgb A1c No results for input(s): HGBA1C in the last 72 hours. Lipid Profile No results for  input(s): CHOL, HDL, LDLCALC, TRIG, CHOLHDL, LDLDIRECT in the last 72 hours. Thyroid function studies No results for input(s): TSH, T4TOTAL, T3FREE, THYROIDAB in the last 72 hours.  Invalid input(s): FREET3 Anemia work up Recent Labs    09/19/18 0400 09/20/18 0305  FERRITIN 60 53   Urinalysis    Component Value Date/Time   COLORURINE YELLOW (A) 11/30/2017 1923   APPEARANCEUR HAZY (A) 11/30/2017 1923   APPEARANCEUR Clear 06/25/2014 1527   LABSPEC 1.008 11/30/2017 1923   LABSPEC 1.015 06/25/2014 1527   PHURINE 6.0 11/30/2017 1923   GLUCOSEU NEGATIVE 11/30/2017 1923   GLUCOSEU Negative 06/25/2014 1527   HGBUR NEGATIVE 11/30/2017 1923   BILIRUBINUR NEGATIVE 11/30/2017 1923   BILIRUBINUR Negative 06/25/2014 1527   KETONESUR NEGATIVE 11/30/2017 1923   PROTEINUR 30 (A) 11/30/2017 1923   NITRITE NEGATIVE 11/30/2017 1923   LEUKOCYTESUR NEGATIVE 11/30/2017 1923   LEUKOCYTESUR Negative 06/25/2014 1527   Sepsis Labs Invalid input(s): PROCALCITONIN,  WBC,  LACTICIDVEN  FURTHER DISCHARGE INSTRUCTIONS:   Get Medicines reviewed and adjusted: Please take all your medications with you for your next visit with your Primary MD   Laboratory/radiological data: Please request your Primary MD to go over all hospital tests and procedure/radiological results at the follow up, please ask your Primary MD to get all Hospital records sent to his/her office.   In some cases, they will be blood work,  cultures and biopsy results pending at the time of your discharge. Please request that your primary care M.D. goes through all the records of your hospital data and follows up on these results.   Also Note the following: If you experience worsening of your admission symptoms, develop shortness of breath, life threatening emergency, suicidal or homicidal thoughts you must seek medical attention immediately by calling 911 or calling your MD immediately  if symptoms less severe.   You must read complete instructions/literature along with all the possible adverse reactions/side effects for all the Medicines you take and that have been prescribed to you. Take any new Medicines after you have completely understood and accpet all the possible adverse reactions/side effects.    Do not drive when taking Pain medications or sleeping medications (Benzodaizepines)   Do not take more than prescribed Pain, Sleep and Anxiety Medications. It is not advisable to combine anxiety,sleep and pain medications without talking with your primary care practitioner   Special Instructions: If you have smoked or chewed Tobacco  in the last 2 yrs please stop smoking, stop any regular Alcohol  and or any Recreational drug use.   Wear Seat belts while driving.   Please note: You were cared for by a hospitalist during your hospital stay. Once you are discharged, your primary care physician will handle any further medical issues. Please note that NO REFILLS for any discharge medications will be authorized once you are discharged, as it is imperative that you return to your primary care physician (or establish a relationship with a primary care physician if you do not have one) for your post hospital discharge needs so that they can reassess your need for medications and monitor your lab values.  Time coordinating discharge: 25 minutes  SIGNED:  Pamella Pertostin Gala Padovano, MD, PhD 09/21/2018, 10:48 AM

## 2018-09-22 LAB — CULTURE, BLOOD (ROUTINE X 2)
Culture: NO GROWTH
Culture: NO GROWTH
Special Requests: ADEQUATE
Special Requests: ADEQUATE

## 2018-10-10 ENCOUNTER — Other Ambulatory Visit: Payer: Self-pay | Admitting: Internal Medicine

## 2018-10-10 ENCOUNTER — Ambulatory Visit
Admission: RE | Admit: 2018-10-10 | Discharge: 2018-10-10 | Disposition: A | Discharge status: Home / Self Care | Payer: HRSA Program | Source: Ambulatory Visit | Attending: Internal Medicine | Admitting: Internal Medicine

## 2018-10-10 DIAGNOSIS — U071 COVID-19: Secondary | ICD-10-CM | POA: Insufficient documentation

## 2019-11-05 ENCOUNTER — Other Ambulatory Visit: Payer: Self-pay

## 2019-11-05 ENCOUNTER — Emergency Department: Payer: Self-pay

## 2019-11-05 DIAGNOSIS — R1030 Lower abdominal pain, unspecified: Secondary | ICD-10-CM | POA: Insufficient documentation

## 2019-11-05 DIAGNOSIS — Z3A01 Less than 8 weeks gestation of pregnancy: Secondary | ICD-10-CM | POA: Insufficient documentation

## 2019-11-05 DIAGNOSIS — R509 Fever, unspecified: Secondary | ICD-10-CM | POA: Insufficient documentation

## 2019-11-05 DIAGNOSIS — O26891 Other specified pregnancy related conditions, first trimester: Secondary | ICD-10-CM | POA: Insufficient documentation

## 2019-11-05 DIAGNOSIS — Z5321 Procedure and treatment not carried out due to patient leaving prior to being seen by health care provider: Secondary | ICD-10-CM | POA: Insufficient documentation

## 2019-11-05 LAB — CBC
HCT: 37.2 % (ref 36.0–46.0)
Hemoglobin: 12.1 g/dL (ref 12.0–15.0)
MCH: 25.9 pg — ABNORMAL LOW (ref 26.0–34.0)
MCHC: 32.5 g/dL (ref 30.0–36.0)
MCV: 79.5 fL — ABNORMAL LOW (ref 80.0–100.0)
Platelets: 346 10*3/uL (ref 150–400)
RBC: 4.68 MIL/uL (ref 3.87–5.11)
RDW: 16.1 % — ABNORMAL HIGH (ref 11.5–15.5)
WBC: 14 10*3/uL — ABNORMAL HIGH (ref 4.0–10.5)
nRBC: 0 % (ref 0.0–0.2)

## 2019-11-05 LAB — URINALYSIS, COMPLETE (UACMP) WITH MICROSCOPIC
Bacteria, UA: NONE SEEN
Bilirubin Urine: NEGATIVE
Glucose, UA: NEGATIVE mg/dL
Hgb urine dipstick: NEGATIVE
Ketones, ur: NEGATIVE mg/dL
Leukocytes,Ua: NEGATIVE
Nitrite: NEGATIVE
Protein, ur: 30 mg/dL — AB
Specific Gravity, Urine: 1.016 (ref 1.005–1.030)
pH: 6 (ref 5.0–8.0)

## 2019-11-05 LAB — COMPREHENSIVE METABOLIC PANEL
ALT: 79 U/L — ABNORMAL HIGH (ref 0–44)
AST: 57 U/L — ABNORMAL HIGH (ref 15–41)
Albumin: 3.7 g/dL (ref 3.5–5.0)
Alkaline Phosphatase: 95 U/L (ref 38–126)
Anion gap: 8 (ref 5–15)
BUN: 12 mg/dL (ref 6–20)
CO2: 22 mmol/L (ref 22–32)
Calcium: 9.2 mg/dL (ref 8.9–10.3)
Chloride: 104 mmol/L (ref 98–111)
Creatinine, Ser: 0.56 mg/dL (ref 0.44–1.00)
GFR calc Af Amer: >60 mL/min (ref >60)
GFR calc non Af Amer: >60 mL/min (ref >60)
Glucose, Bld: 101 mg/dL — ABNORMAL HIGH (ref 70–99)
Potassium: 4.2 mmol/L (ref 3.5–5.1)
Sodium: 134 mmol/L — ABNORMAL LOW (ref 135–145)
Total Bilirubin: 0.5 mg/dL (ref 0.3–1.2)
Total Protein: 7.8 g/dL (ref 6.5–8.1)

## 2019-11-05 LAB — HCG, QUANTITATIVE, PREGNANCY: hCG, Beta Chain, Quant, S: 53056 m[IU]/mL — ABNORMAL HIGH (ref <5)

## 2019-11-05 LAB — PREGNANCY, URINE: Preg Test, Ur: POSITIVE — AB

## 2019-11-05 LAB — LIPASE, BLOOD: Lipase: 26 U/L (ref 11–51)

## 2019-11-05 NOTE — ED Notes (Signed)
No answer when u/s called pt from lobby, outside & cellphone

## 2019-11-05 NOTE — ED Triage Notes (Signed)
Pt comes POV with right sided abdominal pain and fever for a couple of days. Just found out she was pregnant.

## 2019-11-06 ENCOUNTER — Emergency Department
Admission: EM | Admit: 2019-11-06 | Discharge: 2019-11-06 | Disposition: A | Discharge status: Home / Self Care | Payer: Self-pay | Attending: Emergency Medicine | Admitting: Emergency Medicine

## 2019-11-06 DIAGNOSIS — R102 Pelvic and perineal pain: Secondary | ICD-10-CM

## 2019-11-06 NOTE — ED Notes (Signed)
No answer when called several times from lobby 

## 2019-12-08 DIAGNOSIS — O0991 Supervision of high risk pregnancy, unspecified, first trimester: Secondary | ICD-10-CM | POA: Insufficient documentation

## 2019-12-08 DIAGNOSIS — O1211 Gestational proteinuria, first trimester: Secondary | ICD-10-CM | POA: Insufficient documentation

## 2020-01-01 IMAGING — MR MR ABDOMEN W/O CM
12 of 14 series · 35 of 48 positions shown · non-contrast
Comparison: CT 10/15/2013

CLINICAL DATA: Pregnant female with white count and fever, pelvic
pain patient is 5 weeks pregnant

EXAM:
MRI ABDOMEN AND PELVIS WITHOUT CONTRAST
TECHNIQUE: Multiplanar multisequence MR imaging of the abdomen and pelvis was
performed. No intravenous contrast was administered.

[Series 3: cor haste · coronal · 6.0mm · 1.25mm/px · 1 of 39 slices shown]
[im 1/39]
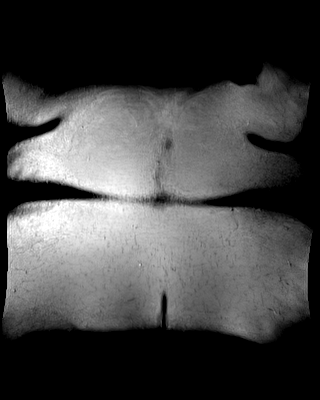

[Series 4: cor haste fs · coronal · 6.0mm · 1.25mm/px · 2 of 39 slices shown]
[im 1/39]
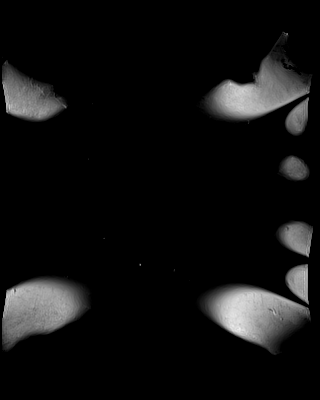
[im 39/39]
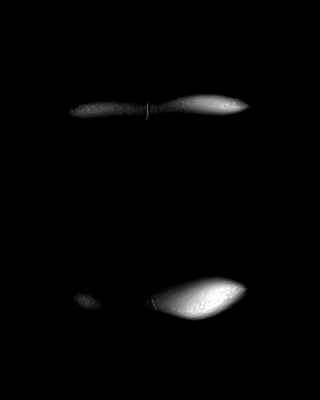

[Series 5: bSSFP · coronal · 6.0mm · 0.70mm/px · 2 of 39 slices shown (1 of 4)]
[im 1/39]
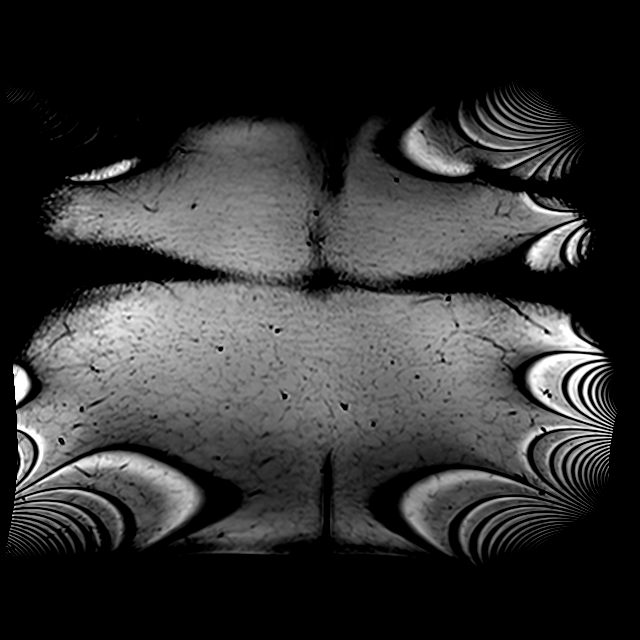
[im 39/39]
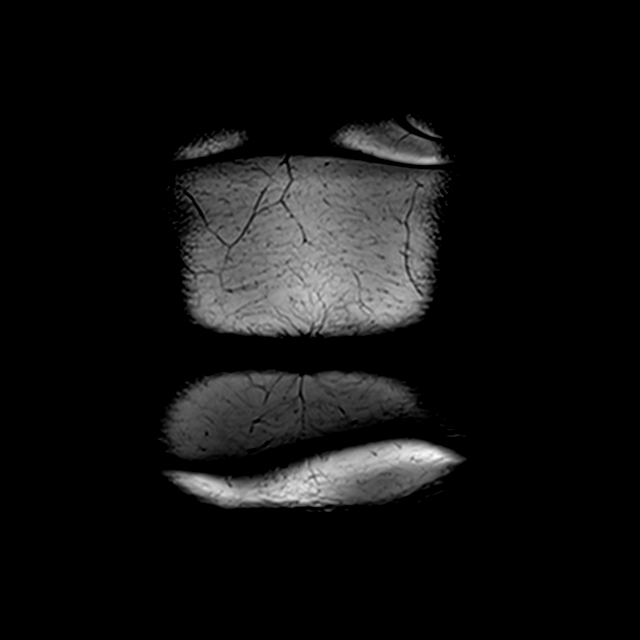

[Series 6: axial haste · axial · 5.0mm · 1.09mm/px · z∈[-24,+210]mm · 3 of 40 slices shown (1 of 2)]
[im 1/40]
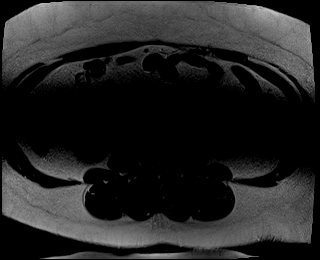
[im 20/40]
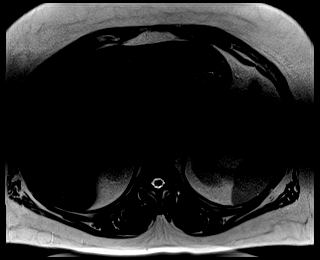
[im 40/40]
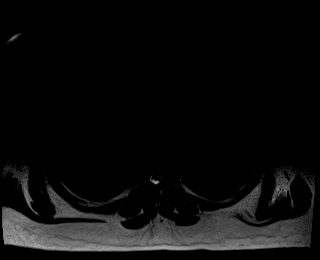

[Series 7: axial haste · axial · 5.0mm · 1.09mm/px · z∈[-264,-30]mm · 3 of 40 slices shown (2 of 2)]
[im 1/40]
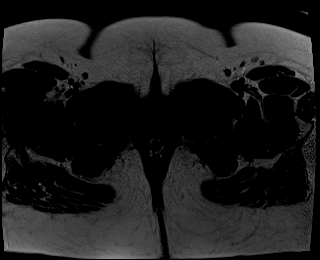
[im 20/40]
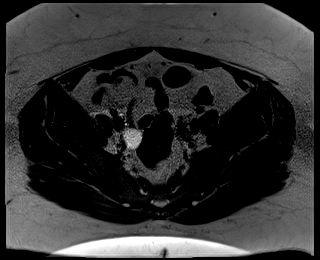
[im 40/40]
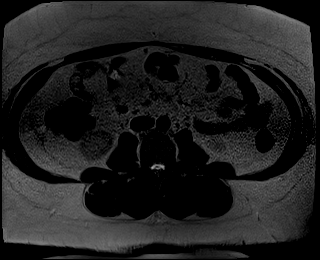

[Series 8: axial haste_comp · axial · 5.0mm · 1.09mm/px · z∈[-264,+210]mm · 5 of 80 slices shown]
[im 1/80]
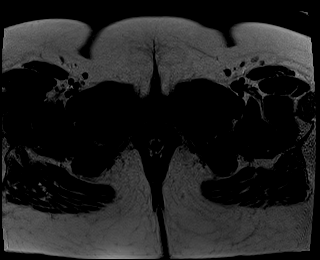
[im 20/80]
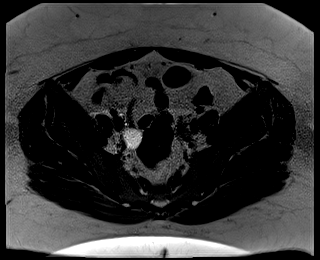
[im 40/80]
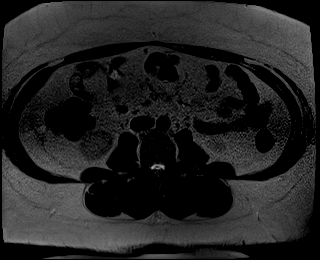
[im 60/80]
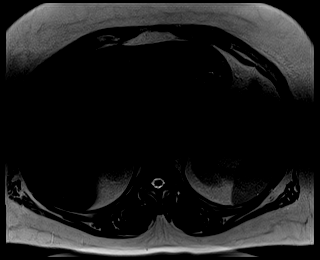
[im 80/80]
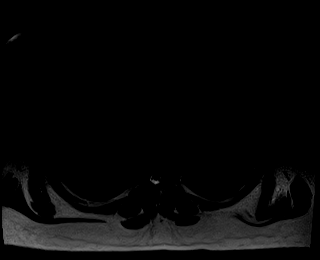

[Series 9: axial haste fs · axial · 5.0mm · 1.09mm/px · z∈[-24,+210]mm · 3 of 40 slices shown (1 of 2)]
[im 1/40]
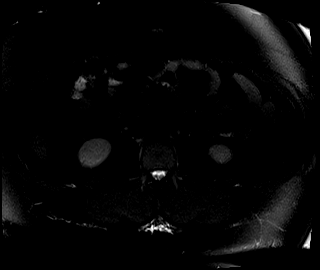
[im 20/40]
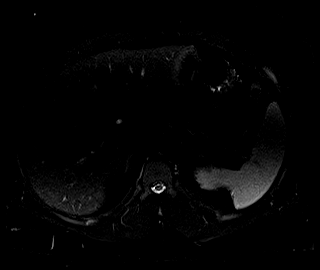
[im 40/40]
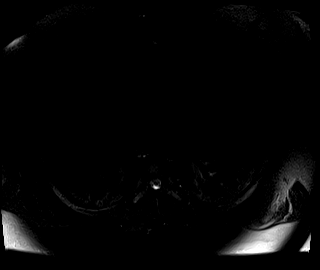

[Series 10: axial haste fs · axial · 5.0mm · 1.09mm/px · z∈[-264,-30]mm · 3 of 40 slices shown (2 of 2)]
[im 1/40]
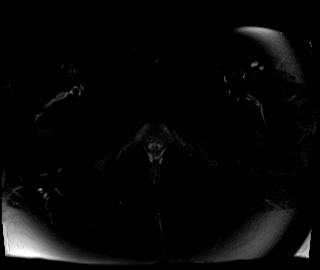
[im 20/40]
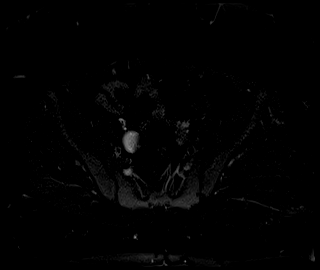
[im 40/40]
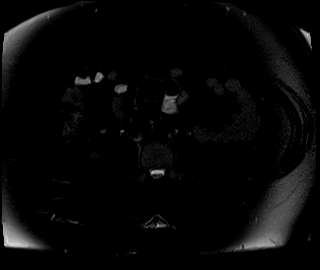

[Series 11: axial haste fs_comp · axial · 5.0mm · 1.09mm/px · z∈[-264,+210]mm · 5 of 80 slices shown]
[im 1/80]
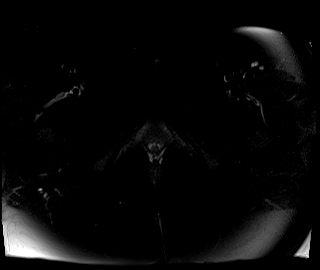
[im 20/80]
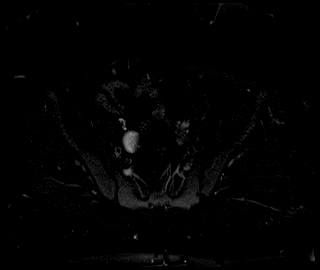
[im 40/80]
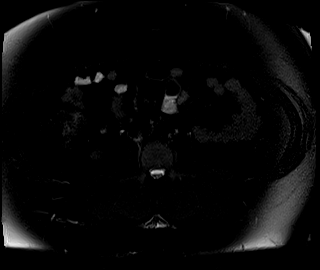
[im 60/80]
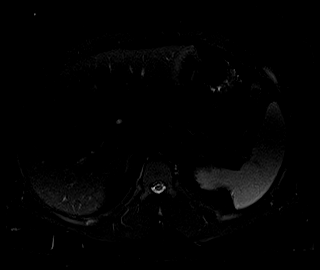
[im 80/80]
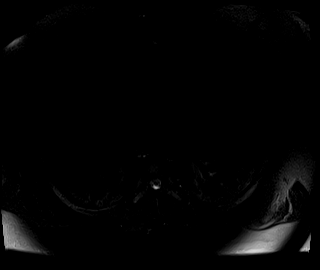

[Series 12: bSSFP · axial · 5.0mm · 0.68mm/px · z∈[-24,+210]mm · 3 of 40 slices shown (2 of 4)]
[im 1/40]
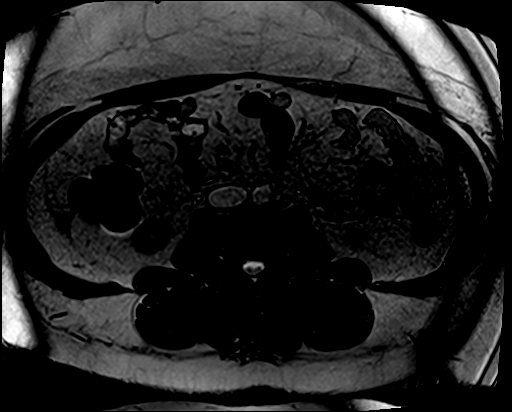
[im 20/40]
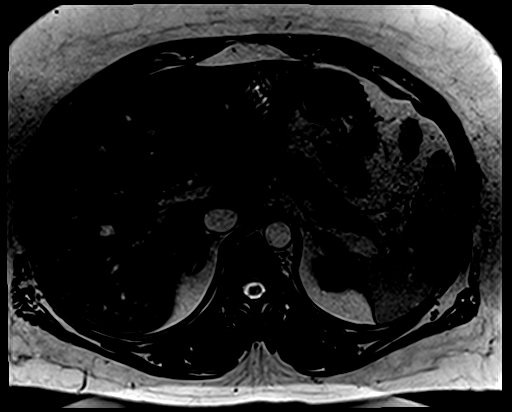
[im 40/40]
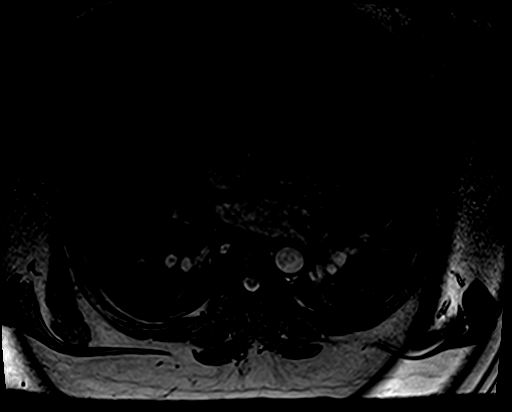

[Series 13: bSSFP · axial · 5.0mm · 0.68mm/px · z∈[-264,-30]mm · 3 of 40 slices shown (3 of 4)]
[im 1/40]
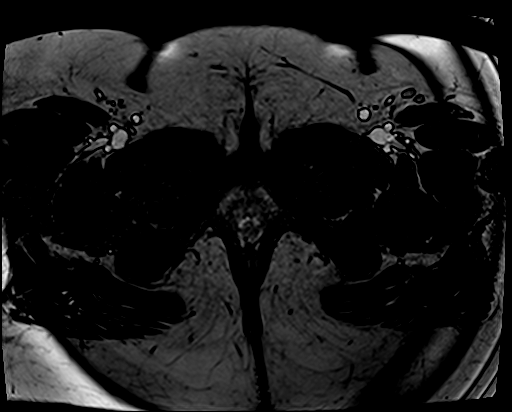
[im 20/40]
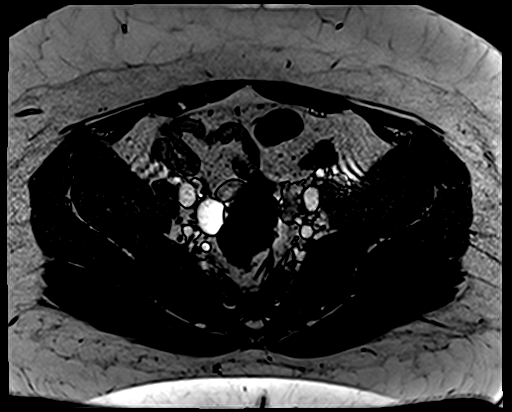
[im 40/40]
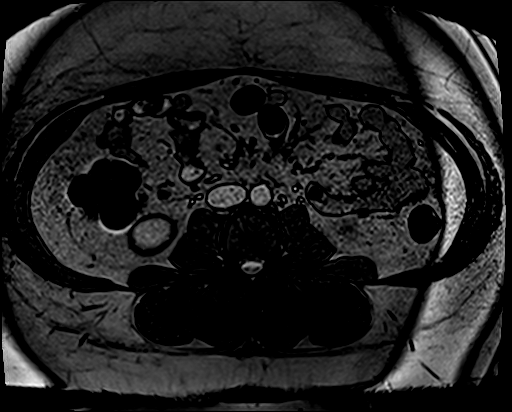

[Series 14: bSSFP · axial · 5.0mm · 0.68mm/px · z∈[-264,-150]mm · 2 of 80 slices shown (4 of 4)]
[im 1/80]
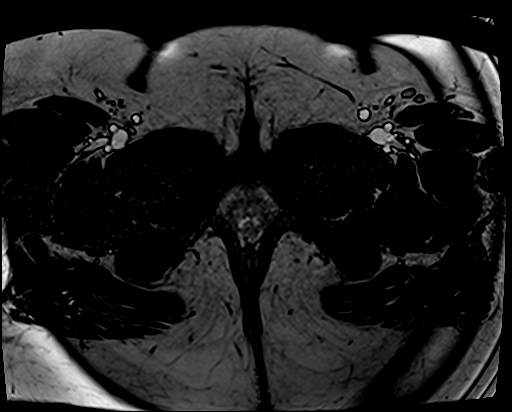
[im 20/80]
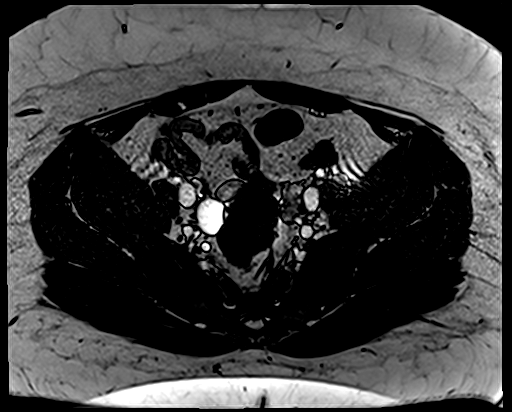

[35 of 48 positions shown; findings below may reference images not displayed]

FINDINGS: COMBINED FINDINGS FOR BOTH MR ABDOMEN AND PELVIS

Lower chest: No acute findings.

Hepatobiliary: Gallstone. No gallbladder wall thickening or
inflammatory change. No focal hepatic abnormality or biliary
enlargement

Pancreas: No mass, inflammatory changes, or other parenchymal
abnormality identified.

Spleen:  Within normal limits in size and appearance.

Adrenals/Urinary Tract: No masses identified. No evidence of
hydronephrosis.

Stomach/Bowel: Visualized portions within the abdomen are
unremarkable. Appendix is visualized and is within normal limits. No
right lower quadrant inflammatory process

Vascular/Lymphatic: No pathologically enlarged lymph nodes
identified. No abdominal aortic aneurysm demonstrated.

Reproductive: 2.4 cm right ovarian cyst. Thickened endometrial
stripe presumably due to early pregnancy.

Other:  No significant free fluid.

Musculoskeletal: No suspicious bone lesions identified. No marrow
edema
IMPRESSION: 1. Negative for acute appendicitis
2. Cholelithiasis without MRI evidence for biliary dilatation or
acute cholecystitis
3. 2.4 cm right ovarian cyst

## 2020-04-06 DIAGNOSIS — Z9189 Other specified personal risk factors, not elsewhere classified: Secondary | ICD-10-CM | POA: Insufficient documentation

## 2020-04-14 DIAGNOSIS — R030 Elevated blood-pressure reading, without diagnosis of hypertension: Secondary | ICD-10-CM | POA: Insufficient documentation

## 2020-05-10 ENCOUNTER — Other Ambulatory Visit: Payer: Self-pay

## 2020-05-10 ENCOUNTER — Ambulatory Visit (LOCAL_COMMUNITY_HEALTH_CENTER): Payer: Self-pay

## 2020-05-10 ENCOUNTER — Other Ambulatory Visit: Payer: Self-pay | Admitting: Family Medicine

## 2020-05-10 VITALS — Wt 294.0 lb

## 2020-05-10 DIAGNOSIS — R7612 Nonspecific reaction to cell mediated immunity measurement of gamma interferon antigen response without active tuberculosis: Secondary | ICD-10-CM

## 2020-05-12 ENCOUNTER — Telehealth: Payer: Self-pay

## 2020-05-17 ENCOUNTER — Other Ambulatory Visit: Payer: Self-pay

## 2020-05-17 ENCOUNTER — Ambulatory Visit
Admission: RE | Admit: 2020-05-17 | Discharge: 2020-05-17 | Disposition: A | Discharge status: Home / Self Care | Payer: Self-pay | Source: Ambulatory Visit | Attending: Family Medicine | Admitting: Family Medicine

## 2020-05-17 ENCOUNTER — Ambulatory Visit
Admission: RE | Admit: 2020-05-17 | Discharge: 2020-05-17 | Disposition: A | Discharge status: Home / Self Care | Payer: Self-pay | Attending: Family Medicine | Admitting: Family Medicine

## 2020-05-17 DIAGNOSIS — R7612 Nonspecific reaction to cell mediated immunity measurement of gamma interferon antigen response without active tuberculosis: Secondary | ICD-10-CM

## 2020-05-17 NOTE — Progress Notes (Signed)
EPI completed via phone. See care everywhere for +QFT 04/02/2020. Patient cannot go for CXR until 05/17/20.  Discussed LTBI vs Active TB and treatment regimens.  Patient is interested in LTBI tx; explained that there is currently a Rifampin shortage and once this resolves TB RN will f/u with patient. Richmond Campbell, RN

## 2020-05-17 NOTE — Telephone Encounter (Signed)
VM received from [facility]/ [employer] CNM. See EPI document Richmond Campbell, RN

## 2020-05-18 ENCOUNTER — Telehealth: Payer: Self-pay

## 2020-05-18 DIAGNOSIS — R7612 Nonspecific reaction to cell mediated immunity measurement of gamma interferon antigen response without active tuberculosis: Secondary | ICD-10-CM

## 2020-05-18 NOTE — Telephone Encounter (Signed)
TC to patient.  Informed of CXR without Active TB. TB RN will contact patient once Rifampin shortage resolves, will proceed with LTBI tx at that time if patient wishes to do so.   [facility] Epic message sent to Hilbert Odor, [employer] CNM, and informed of the above Richmond Campbell, RN

## 2021-03-23 ENCOUNTER — Telehealth: Payer: Self-pay

## 2021-03-23 NOTE — Telephone Encounter (Signed)
TC with patient.  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  Discussed Rifampin regimen and 4 month committment. Patient would like to think about starting tx and will call back if she wants to proceed. Richmond Campbell, RN
